# Patient Record
Sex: Male | Born: 1965 | Race: Black or African American | Hispanic: No | Marital: Married | Smoking: Former smoker
Health system: Southern US, Community
[De-identification: ages and names within clinical notes are randomized; demographics above are authoritative.]

## PROBLEM LIST (undated history)

## (undated) DIAGNOSIS — G4730 Sleep apnea, unspecified: Secondary | ICD-10-CM

## (undated) DIAGNOSIS — E785 Hyperlipidemia, unspecified: Secondary | ICD-10-CM

## (undated) DIAGNOSIS — G4733 Obstructive sleep apnea (adult) (pediatric): Secondary | ICD-10-CM

## (undated) DIAGNOSIS — I10 Essential (primary) hypertension: Secondary | ICD-10-CM

## (undated) HISTORY — PX: COLONOSCOPY: SHX174

## (undated) HISTORY — PX: POLYPECTOMY: SHX149

## (undated) HISTORY — DX: Obstructive sleep apnea (adult) (pediatric): G47.33

## (undated) HISTORY — PX: NO PAST SURGERIES: SHX2092

## (undated) HISTORY — DX: Essential (primary) hypertension: I10

## (undated) HISTORY — DX: Sleep apnea, unspecified: G47.30

## (undated) HISTORY — DX: Hyperlipidemia, unspecified: E78.5

---

## 2005-11-29 ENCOUNTER — Emergency Department (HOSPITAL_COMMUNITY): Admission: EM | Admit: 2005-11-29 | Discharge: 2005-11-29 | Payer: Self-pay | Admitting: Emergency Medicine

## 2010-10-03 ENCOUNTER — Emergency Department (HOSPITAL_COMMUNITY)
Admission: EM | Admit: 2010-10-03 | Discharge: 2010-10-04 | Disposition: A | Discharge status: Home / Self Care | Payer: BC Managed Care – PPO | Attending: Emergency Medicine | Admitting: Emergency Medicine

## 2010-10-03 DIAGNOSIS — K029 Dental caries, unspecified: Secondary | ICD-10-CM | POA: Insufficient documentation

## 2010-10-03 DIAGNOSIS — I10 Essential (primary) hypertension: Secondary | ICD-10-CM | POA: Insufficient documentation

## 2010-10-04 ENCOUNTER — Emergency Department (HOSPITAL_COMMUNITY): Payer: BC Managed Care – PPO

## 2010-10-04 LAB — POCT I-STAT TROPONIN I: Troponin i, poc: 0.04 ng/mL (ref 0.00–0.08)

## 2010-10-04 LAB — URINALYSIS, ROUTINE W REFLEX MICROSCOPIC
Glucose, UA: NEGATIVE mg/dL
Ketones, ur: NEGATIVE mg/dL
Leukocytes, UA: NEGATIVE
pH: 5.5 (ref 5.0–8.0)

## 2010-10-04 LAB — POCT I-STAT, CHEM 8
BUN: 22 mg/dL (ref 6–23)
Calcium, Ion: 1.24 mmol/L (ref 1.12–1.32)
Chloride: 103 mEq/L (ref 96–112)

## 2010-10-16 ENCOUNTER — Inpatient Hospital Stay (INDEPENDENT_AMBULATORY_CARE_PROVIDER_SITE_OTHER)
Admission: RE | Admit: 2010-10-16 | Discharge: 2010-10-16 | Disposition: A | Discharge status: Home / Self Care | Payer: BC Managed Care – PPO | Source: Ambulatory Visit | Attending: Emergency Medicine | Admitting: Emergency Medicine

## 2010-10-16 DIAGNOSIS — I10 Essential (primary) hypertension: Secondary | ICD-10-CM

## 2012-02-08 ENCOUNTER — Encounter: Payer: Self-pay | Admitting: Internal Medicine

## 2012-02-08 ENCOUNTER — Ambulatory Visit (INDEPENDENT_AMBULATORY_CARE_PROVIDER_SITE_OTHER): Payer: BC Managed Care – PPO | Admitting: Internal Medicine

## 2012-02-08 VITALS — BP 170/100 | HR 76 | Temp 97.9°F | Resp 18 | Ht 66.0 in | Wt 250.0 lb

## 2012-02-08 DIAGNOSIS — E669 Obesity, unspecified: Secondary | ICD-10-CM

## 2012-02-08 DIAGNOSIS — E6609 Other obesity due to excess calories: Secondary | ICD-10-CM

## 2012-02-08 DIAGNOSIS — Z0000 Encounter for general adult medical examination without abnormal findings: Secondary | ICD-10-CM

## 2012-02-08 DIAGNOSIS — I10 Essential (primary) hypertension: Secondary | ICD-10-CM

## 2012-02-08 LAB — GLUCOSE, POCT (MANUAL RESULT ENTRY): POC Glucose: 107 mg/dl — AB (ref 70–99)

## 2012-02-08 MED ORDER — AMLODIPINE BESYLATE 10 MG PO TABS: 10.0000 mg | ORAL_TABLET | Freq: Every day | ORAL | Status: DC

## 2012-02-08 MED ORDER — HYDROCHLOROTHIAZIDE 25 MG PO TABS: 25.0000 mg | ORAL_TABLET | Freq: Every day | ORAL | Status: DC

## 2012-02-08 NOTE — Patient Instructions (Addendum)
Limit your sodium (Salt) intake    It is important that you exercise regularly, at least 20 minutes 3 to 4 times per week.  If you develop chest pain or shortness of breath seek  medical attention.  You need to lose weight.  Consider a lower calorie diet and regular exercise.  Please check your blood pressure on a regular basis.  If it is consistently greater than 150/90, please make an office appointment.  Return in 6 months for follow-up Arterial Hypertension Arterial hypertension (high blood pressure) is a condition of elevated pressure in your blood vessels. Hypertension over a long period of time is a risk factor for strokes, heart attacks, and heart failure. It is also the leading cause of kidney (renal) failure.   CAUSES    In Adults -- Over 90% of all hypertension has no known cause. This is called essential or primary hypertension. In the other 10% of people with hypertension, the increase in blood pressure is caused by another disorder. This is called secondary hypertension. Important causes of secondary hypertension are:   Heavy alcohol use.   Obstructive sleep apnea.   Hyperaldosterosim (Conn's syndrome).   Steroid use.   Chronic kidney failure.   Hyperparathyroidism.   Medications.   Renal artery stenosis.   Pheochromocytoma.   Cushing's disease.   Coarctation of the aorta.   Scleroderma renal crisis.   Licorice (in excessive amounts).   Drugs (cocaine, methamphetamine).  Your caregiver can explain any items above that apply to you.  In Children -- Secondary hypertension is more common and should always be considered.   Pregnancy -- Few women of childbearing age have high blood pressure. However, up to 10% of them develop hypertension of pregnancy. Generally, this will not harm the woman. It may be a sign of 3 complications of pregnancy: preeclampsia, HELLP syndrome, and eclampsia. Follow up and control with medication is necessary.  SYMPTOMS    This  condition normally does not produce any noticeable symptoms. It is usually found during a routine exam.   Malignant hypertension is a late problem of high blood pressure. It may have the following symptoms:   Headaches.   Blurred vision.   End-organ damage (this means your kidneys, heart, lungs, and other organs are being damaged).   Stressful situations can increase the blood pressure. If a person with normal blood pressure has their blood pressure go up while being seen by their caregiver, this is often termed "white coat hypertension." Its importance is not known. It may be related with eventually developing hypertension or complications of hypertension.   Hypertension is often confused with mental tension, stress, and anxiety.  DIAGNOSIS   The diagnosis is made by 3 separate blood pressure measurements. They are taken at least 1 week apart from each other. If there is organ damage from hypertension, the diagnosis may be made without repeat measurements. Hypertension is usually identified by having blood pressure readings:  Above 140/90 mmHg measured in both arms, at 3 separate times, over a couple weeks.   Over 130/80 mmHg should be considered a risk factor and may require treatment in patients with diabetes.  Blood pressure readings over 120/80 mmHg are called "pre-hypertension" even in non-diabetic patients. To get a true blood pressure measurement, use the following guidelines. Be aware of the factors that can alter blood pressure readings.  Take measurements at least 1 hour after caffeine.   Take measurements 30 minutes after smoking and without any stress. This is another reason to  quit smoking  it raises your blood pressure.   Use a proper cuff size. Ask your caregiver if you are not sure about your cuff size.   Most home blood pressure cuffs are automatic. They will measure systolic and diastolic pressures. The systolic pressure is the pressure reading at the start of sounds.  Diastolic pressure is the pressure at which the sounds disappear. If you are elderly, measure pressures in multiple postures. Try sitting, lying or standing.   Sit at rest for a minimum of 5 minutes before taking measurements.   You should not be on any medications like decongestants. These are found in many cold medications.   Record your blood pressure readings and review them with your caregiver.  If you have hypertension:  Your caregiver may do tests to be sure you do not have secondary hypertension (see "causes" above).   Your caregiver may also look for signs of metabolic syndrome. This is also called Syndrome X or Insulin Resistance Syndrome. You may have this syndrome if you have type 2 diabetes, abdominal obesity, and abnormal blood lipids in addition to hypertension.   Your caregiver will take your medical and family history and perform a physical exam.   Diagnostic tests may include blood tests (for glucose, cholesterol, potassium, and kidney function), a urinalysis, or an EKG. Other tests may also be necessary depending on your condition.  PREVENTION   There are important lifestyle issues that you can adopt to reduce your chance of developing hypertension:  Maintain a normal weight.   Limit the amount of salt (sodium) in your diet.   Exercise often.   Limit alcohol intake.   Get enough potassium in your diet. Discuss specific advice with your caregiver.   Follow a DASH diet (dietary approaches to stop hypertension). This diet is rich in fruits, vegetables, and low-fat dairy products, and avoids certain fats.  PROGNOSIS   Essential hypertension cannot be cured. Lifestyle changes and medical treatment can lower blood pressure and reduce complications. The prognosis of secondary hypertension depends on the underlying cause. Many people whose hypertension is controlled with medicine or lifestyle changes can live a normal, healthy life.   RISKS AND COMPLICATIONS   While high  blood pressure alone is not an illness, it often requires treatment due to its short- and long-term effects on many organs. Hypertension increases your risk for:  CVAs or strokes (cerebrovascular accident).   Heart failure due to chronically high blood pressure (hypertensive cardiomyopathy).   Heart attack (myocardial infarction).   Damage to the retina (hypertensive retinopathy).   Kidney failure (hypertensive nephropathy).  Your caregiver can explain list items above that apply to you. Treatment of hypertension can significantly reduce the risk of complications. TREATMENT    For overweight patients, weight loss and regular exercise are recommended. Physical fitness lowers blood pressure.   Mild hypertension is usually treated with diet and exercise. A diet rich in fruits and vegetables, fat-free dairy products, and foods low in fat and salt (sodium) can help lower blood pressure. Decreasing salt intake decreases blood pressure in a 1/3 of people.   Stop smoking if you are a smoker.  The steps above are highly effective in reducing blood pressure. While these actions are easy to suggest, they are difficult to achieve. Most patients with moderate or severe hypertension end up requiring medications to bring their blood pressure down to a normal level. There are several classes of medications for treatment. Blood pressure pills (antihypertensives) will lower blood pressure by  their different actions. Lowering the blood pressure by 10 mmHg may decrease the risk of complications by as much as 25%. The goal of treatment is effective blood pressure control. This will reduce your risk for complications. Your caregiver will help you determine the best treatment for you according to your lifestyle. What is excellent treatment for one person, may not be for you. HOME CARE INSTRUCTIONS    Do not smoke.   Follow the lifestyle changes outlined in the "Prevention" section.   If you are on medications,  follow the directions carefully. Blood pressure medications must be taken as prescribed. Skipping doses reduces their benefit. It also puts you at risk for problems.   Follow up with your caregiver, as directed.   If you are asked to monitor your blood pressure at home, follow the guidelines in the "Diagnosis" section above.  SEEK MEDICAL CARE IF:    You think you are having medication side effects.   You have recurrent headaches or lightheadedness.   You have swelling in your ankles.   You have trouble with your vision.  SEEK IMMEDIATE MEDICAL CARE IF:    You have sudden onset of chest pain or pressure, difficulty breathing, or other symptoms of a heart attack.   You have a severe headache.   You have symptoms of a stroke (such as sudden weakness, difficulty speaking, difficulty walking).  MAKE SURE YOU:    Understand these instructions.   Will watch your condition.   Will get help right away if you are not doing well or get worse.  Document Released: 02/08/2005 Document Revised: 05/03/2011 Document Reviewed: 09/08/2006 [facility] Patient Information 2013 [place], [place].   DASH Diet The DASH diet stands for "Dietary Approaches to Stop Hypertension." It is a healthy eating plan that has been shown to reduce high blood pressure (hypertension) in as little as 14 days, while also possibly providing other significant health benefits. These other health benefits include reducing the risk of breast cancer after menopause and reducing the risk of type 2 diabetes, heart disease, colon cancer, and stroke. Health benefits also include weight loss and slowing kidney failure in patients with chronic kidney disease.   DIET GUIDELINES  Limit salt (sodium). Your diet should contain less than 1500 mg of sodium daily.   Limit refined or processed carbohydrates. Your diet should include mostly whole grains. Desserts and added sugars should be used sparingly.   Include small amounts of  heart-healthy fats. These types of fats include nuts, oils, and tub margarine. Limit saturated and trans fats. These fats have been shown to be harmful in the body.  CHOOSING FOODS   The following food groups are based on a 2000 calorie diet. See your Registered Dietitian for individual calorie needs. Grains and Grain Products (6 to 8 servings daily)  Eat More Often: Whole-wheat bread, brown rice, whole-grain or wheat pasta, quinoa, popcorn without added fat or salt (air popped).   Eat Less Often: White bread, white pasta, white rice, cornbread.  Vegetables (4 to 5 servings daily)  Eat More Often: Fresh, frozen, and canned vegetables. Vegetables may be raw, steamed, roasted, or grilled with a minimal amount of fat.   Eat Less Often/Avoid: Creamed or fried vegetables. Vegetables in a cheese sauce.  Fruit (4 to 5 servings daily)  Eat More Often: All fresh, canned (in natural juice), or frozen fruits. Dried fruits without added sugar. One hundred percent fruit juice ( cup [237 mL] daily).   Eat Less Often: Dried fruits  with added sugar. Canned fruit in light or heavy syrup.  [employer], Fish, and Poultry (2 servings or less daily. One serving is 3 to 4 oz [85-114 g]).  Eat More Often: Ninety percent or leaner ground beef, tenderloin, sirloin. Round cuts of beef, chicken breast, [place] breast. All fish. Grill, bake, or broil your meat. Nothing should be fried.   Eat Less Often/Avoid: Fatty cuts of meat, [place], or chicken leg, thigh, or wing. Fried cuts of meat or fish.  Dairy (2 to 3 servings)  Eat More Often: Low-fat or fat-free milk, low-fat plain or light yogurt, reduced-fat or part-skim cheese.   Eat Less Often/Avoid: Milk (whole, 2%). Whole milk yogurt. Full-fat cheeses.  Nuts, Seeds, and Legumes (4 to 5 servings per week)  Eat More Often: All without added salt.   Eat Less Often/Avoid: Salted nuts and seeds, canned beans with added salt.  Fats and Sweets (limited)  Eat More  Often: Vegetable oils, tub margarines without trans fats, sugar-free gelatin. Mayonnaise and salad dressings.   Eat Less Often/Avoid: Coconut oils, palm oils, butter, stick margarine, cream, half and half, cookies, candy, pie.  FOR MORE INFORMATION The Dash Diet Eating Plan: www.dashdiet.org Document Released: 01/28/2011 Document Revised: 05/03/2011 Document Reviewed: 01/28/2011 [facility] Patient Information 2013 [place], [place].   Diet for Gastroesophageal Reflux Disease, Adult Reflux (acid reflux) is when acid from your stomach flows up into the esophagus. When acid comes in contact with the esophagus, the acid causes irritation and soreness (inflammation) in the esophagus. When reflux happens often or so severely that it causes damage to the esophagus, it is called gastroesophageal reflux disease (GERD). Nutrition therapy can help ease the discomfort of GERD. FOODS OR DRINKS TO AVOID OR LIMIT  Smoking or chewing tobacco. Nicotine is one of the most potent stimulants to acid production in the gastrointestinal tract.   Caffeinated and decaffeinated coffee and black tea.   Regular or low-calorie carbonated beverages or energy drinks (caffeine-free carbonated beverages are allowed).     Strong spices, such as black pepper, white pepper, red pepper, cayenne, curry powder, and chili powder.   Peppermint or spearmint.   Chocolate.   High-fat foods, including meats and fried foods. Extra added fats including oils, butter, salad dressings, and nuts. Limit these to less than 8 tsp per day.   Fruits and vegetables if they are not tolerated, such as citrus fruits or tomatoes.   Alcohol.   Any food that seems to aggravate your condition.  If you have questions regarding your diet, call your caregiver or a registered dietitian. OTHER THINGS THAT MAY HELP GERD INCLUDE:    Eating your meals slowly, in a relaxed setting.   Eating 5 to 6 small meals per day instead of 3 large meals.    Eliminating food for a period of time if it causes distress.   Not lying down until 3 hours after eating a meal.   Keeping the head of your bed raised 6 to 9 inches (15 to 23 cm) by using a foam wedge or blocks under the legs of the bed. Lying flat may make symptoms worse.   Being physically active. Weight loss may be helpful in reducing reflux in overweight or obese adults.   Wear loose fitting clothing  EXAMPLE MEAL PLAN This meal plan is approximately 2,000 calories based on https://www.bernard.org/ meal planning guidelines. Breakfast   cup cooked oatmeal.   1 cup strawberries.   1 cup low-fat milk.   1 oz almonds.  Snack  1 cup cucumber slices.   6 oz yogurt (made from low-fat or fat-free milk).  Lunch  2 slice whole-wheat bread.   2 oz sliced [place].   2 tsp mayonnaise.   1 cup blueberries.   1 cup snap peas.  Snack  6 whole-wheat crackers.   1 oz string cheese.  Dinner   cup brown rice.   1 cup mixed veggies.   1 tsp olive oil.   3 oz grilled fish.  Document Released: 02/08/2005 Document Revised: 05/03/2011 Document Reviewed: 12/25/2010 [facility] Patient Information 2013 [place], [place].

## 2012-02-08 NOTE — Progress Notes (Signed)
Subjective:    Patient ID: Gregory Stephenson, male    DOB: 1965/12/24, 46 y.o.   MRN: [id number]  HPI 46 year old gentleman who is seen today to establish with our practice. He has a two-year history of treated hypertension but has been out of medications for about one month. He has no concerns or complaints. He discontinued tobacco use in 1997 and there is been some significant weight gain. His weight is presently 250. No other concerns or complaints. Past medical history is otherwise unremarkable. No hospitalizations or surgeries No allergies  Family history father died in his 38s complications of advanced COPD also may have had a cardiac condition Mother is 64 with a history of low back pain 5 brothers and 5 sisters positive for COPD asthma hypertension and diabetes Social history married one daughter and one grandson. Discontinued tobacco in 1997. 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 Hypertension     History   Social History  . Marital Status: Divorced    Spouse Name: N/A    Number of Children: N/A  . Years of Education: N/A   Occupational History  . Not on file.   Social History Main Topics  . Smoking status: Former Smoker    Types: Cigarettes    Quit date: 11/23/1995  . Smokeless tobacco: Never Used  . Alcohol Use: No  . Drug Use: No  . Sexually Active: Yes -- Male partner(s)   Other Topics Concern  . Not on file   Social History Narrative  . No narrative on file    No past surgical history on file.  No family history on file.  No Known Allergies  Current Outpatient Prescriptions on File Prior to Visit  Medication Sig Dispense Refill  . amLODipine (NORVASC) 10 MG tablet       . hydrochlorothiazide (HYDRODIURIL) 25 MG tablet         BP 170/100  Pulse 76  Temp 97.9 F (36.6 C) (Oral)  Resp 18  Ht 5\' 6"  (1.676 m)  Wt 250 lb (113.399 kg)  BMI 40.35 kg/m2  SpO2  97%       Review of Systems  Constitutional: Negative for fever, chills, activity change, appetite change and fatigue.  HENT: Negative for hearing loss, ear pain, congestion, rhinorrhea, sneezing, mouth sores, trouble swallowing, neck pain, neck stiffness, dental problem, voice change, sinus pressure and tinnitus.   Eyes: Negative for photophobia, pain, redness and visual disturbance.  Respiratory: Negative for apnea, cough, choking, chest tightness, shortness of breath and wheezing.   Cardiovascular: Negative for chest pain, palpitations and leg swelling.  Gastrointestinal: Negative for nausea, vomiting, abdominal pain, diarrhea, constipation, blood in stool, abdominal distention, anal bleeding and rectal pain.  Genitourinary: Negative for dysuria, urgency, frequency, hematuria, flank pain, decreased urine volume, discharge, penile swelling, scrotal swelling, difficulty urinating, genital sores and testicular pain.  Musculoskeletal: Negative for myalgias, back pain, joint swelling, arthralgias and gait problem.  Skin: Negative for color change, rash and wound.  Neurological: Negative for dizziness, tremors, seizures, syncope, facial asymmetry, speech difficulty, weakness, light-headedness, numbness and headaches.  Hematological: Negative for adenopathy. Does not bruise/bleed easily.  Psychiatric/Behavioral: Negative for suicidal ideas, hallucinations, behavioral problems, confusion, sleep disturbance, self-injury, dysphoric mood, decreased concentration and agitation. The patient is not nervous/anxious.        Objective:   Physical Exam  Constitutional: He appears well-developed and well-nourished.  HENT:  Head: Normocephalic and atraumatic.  Right  Ear: External ear normal.  Left Ear: External ear normal.  Nose: Nose normal.  Mouth/Throat: Oropharynx is clear and moist.  Eyes: Conjunctivae normal and EOM are normal. Pupils are equal, round, and reactive to light. No scleral icterus.   Neck: Normal range of motion. Neck supple. No JVD present. No thyromegaly present.  Cardiovascular: Regular rhythm, normal heart sounds and intact distal pulses.  Exam reveals no gallop and no friction rub.   No murmur heard. Pulmonary/Chest: Effort normal and breath sounds normal. He exhibits no tenderness.  Abdominal: Soft. Bowel sounds are normal. He exhibits no distension and no mass. There is no tenderness.  Genitourinary: Penis normal.       Uncircumcised  Musculoskeletal: Normal range of motion. He exhibits no edema and no tenderness.  Lymphadenopathy:    He has no cervical adenopathy.  Neurological: He is alert. He has normal reflexes. No cranial nerve deficit. Coordination normal.  Skin: Skin is warm and dry. No rash noted.  Psychiatric: He has a normal mood and affect. His behavior is normal.          Assessment & Plan:   Hypertension. Not well controlled off medication. We'll resume amlodipine and hydrochlorothiazide Obesity. Weight loss exercise encouraged. Home blood pressure monitoring encouraged  Recheck 6 months with lab

## 2012-09-11 ENCOUNTER — Ambulatory Visit (INDEPENDENT_AMBULATORY_CARE_PROVIDER_SITE_OTHER): Payer: BC Managed Care – PPO | Admitting: Internal Medicine

## 2012-09-11 ENCOUNTER — Encounter: Payer: Self-pay | Admitting: Internal Medicine

## 2012-09-11 ENCOUNTER — Ambulatory Visit: Payer: BC Managed Care – PPO | Admitting: Internal Medicine

## 2012-09-11 VITALS — BP 140/90 | HR 72 | Temp 98.6°F | Resp 20 | Wt 234.0 lb

## 2012-09-11 DIAGNOSIS — I10 Essential (primary) hypertension: Secondary | ICD-10-CM

## 2012-09-11 DIAGNOSIS — R3915 Urgency of urination: Secondary | ICD-10-CM

## 2012-09-11 DIAGNOSIS — E669 Obesity, unspecified: Secondary | ICD-10-CM

## 2012-09-11 DIAGNOSIS — E6609 Other obesity due to excess calories: Secondary | ICD-10-CM

## 2012-09-11 LAB — POCT URINALYSIS DIPSTICK
Leukocytes, UA: NEGATIVE
Nitrite, UA: NEGATIVE
Protein, UA: NEGATIVE
Urobilinogen, UA: 0.2
pH, UA: 6

## 2012-09-11 MED ORDER — DOXAZOSIN MESYLATE 4 MG PO TABS: 4.0000 mg | ORAL_TABLET | Freq: Every day | ORAL | Status: DC

## 2012-09-11 NOTE — Progress Notes (Signed)
Subjective:    Patient ID: Gregory Stephenson, male    DOB: [birth year], 47 y.o.   MRN: [id number]  HPI  47 year old patient who presents with a six-month history of urinary urgency and frequency. He states this is worse in the morning after taking blood pressure medications that includes diuretic therapy and improved in the afternoon. He still has nocturia 3-4 times nightly. No real obstructive symptoms. He does not use caffeinated beverages  Past Medical History  Diagnosis Date  . Hypertension     History   Social History  . Marital Status: Divorced    Spouse Name: N/A    Number of Children: N/A  . Years of Education: N/A   Occupational History  . Not on file.   Social History Main Topics  . Smoking status: Former Smoker    Types: Cigarettes    Quit date: 11/23/1995  . Smokeless tobacco: Never Used  . Alcohol Use: No  . Drug Use: No  . Sexually Active: Yes -- Male partner(s)   Other Topics Concern  . Not on file   Social History Narrative  . No narrative on file    History reviewed. No pertinent past surgical history.  No family history on file.  No Known Allergies  Current Outpatient Prescriptions on File Prior to Visit  Medication Sig Dispense Refill  . amLODipine (NORVASC) 10 MG tablet Take 1 tablet (10 mg total) by mouth daily.  90 tablet  3   No current facility-administered medications on file prior to visit.    BP 140/90  Pulse 72  Temp(Src) 98.6 F (37 C) (Oral)  Resp 20  Wt 234 lb (106.142 kg)  BMI 37.79 kg/m2  SpO2 97%       Review of Systems  Constitutional: Negative for fever, chills, appetite change and fatigue.  HENT: Negative for hearing loss, ear pain, congestion, sore throat, trouble swallowing, neck stiffness, dental problem, voice change and tinnitus.   Eyes: Negative for pain, discharge and visual disturbance.  Respiratory: Negative for cough, chest tightness, wheezing and stridor.   Cardiovascular: Negative for chest pain,  palpitations and leg swelling.  Gastrointestinal: Negative for nausea, vomiting, abdominal pain, diarrhea, constipation, blood in stool and abdominal distention.  Genitourinary: Positive for urgency and frequency. Negative for dysuria, hematuria, flank pain, discharge, difficulty urinating and genital sores.  Musculoskeletal: Negative for myalgias, back pain, joint swelling, arthralgias and gait problem.  Skin: Negative for rash.  Neurological: Negative for dizziness, syncope, speech difficulty, weakness, numbness and headaches.  Hematological: Negative for adenopathy. Does not bruise/bleed easily.  Psychiatric/Behavioral: Negative for behavioral problems and dysphoric mood. The patient is not nervous/anxious.        Objective:   Physical Exam  Constitutional: He is oriented to person, place, and time. He appears well-developed.   Blood pressure 140/84  HENT:  Head: Normocephalic.  Right Ear: External ear normal.  Left Ear: External ear normal.  Eyes: Conjunctivae and EOM are normal.  Neck: Normal range of motion.  Cardiovascular: Normal rate and normal heart sounds.   Pulmonary/Chest: Breath sounds normal.  Abdominal: Bowel sounds are normal.  Musculoskeletal: Normal range of motion. He exhibits no edema and no tenderness.  Neurological: He is alert and oriented to person, place, and time.  Psychiatric: He has a normal mood and affect. His behavior is normal.          Assessment & Plan:   Urinary urgency/frequency. Symptoms have been present for about 6 months. Urinalysis is normal. We'll discontinue  diuretic therapy and substitute doxazosin. Schedule CPX in 6 months. He will call if he does not have significant improvement Hypertension. Home blood pressure monitoring encouraged. Low-salt diet recommended

## 2012-09-11 NOTE — Patient Instructions (Addendum)
Discontinue hydrochlorothiazide  Doxazosin one half tablet at bedtime for 3 nights then take one whole tablet daily  Limit your sodium (Salt) intake    It is important that you exercise regularly, at least 20 minutes 3 to 4 times per week.  If you develop chest pain or shortness of breath seek  medical attention.  You need to lose weight.  Consider a lower calorie diet and regular exercise.  Please check your blood pressure on a regular basis.  If it is consistently greater than 150/90, please make an office appointment.

## 2013-02-05 ENCOUNTER — Other Ambulatory Visit: Payer: BC Managed Care – PPO

## 2013-02-12 ENCOUNTER — Ambulatory Visit (INDEPENDENT_AMBULATORY_CARE_PROVIDER_SITE_OTHER): Payer: BC Managed Care – PPO | Admitting: Internal Medicine

## 2013-02-12 ENCOUNTER — Encounter: Payer: Self-pay | Admitting: Internal Medicine

## 2013-02-12 VITALS — BP 120/84 | Temp 98.4°F | Ht 67.0 in | Wt 236.0 lb

## 2013-02-12 DIAGNOSIS — I10 Essential (primary) hypertension: Secondary | ICD-10-CM

## 2013-02-12 DIAGNOSIS — E6609 Other obesity due to excess calories: Secondary | ICD-10-CM

## 2013-02-12 DIAGNOSIS — Z23 Encounter for immunization: Secondary | ICD-10-CM

## 2013-02-12 DIAGNOSIS — Z0000 Encounter for general adult medical examination without abnormal findings: Secondary | ICD-10-CM

## 2013-02-12 DIAGNOSIS — E669 Obesity, unspecified: Secondary | ICD-10-CM

## 2013-02-12 LAB — LDL CHOLESTEROL, DIRECT: Direct LDL: 213.7 mg/dL

## 2013-02-12 LAB — CBC WITH DIFFERENTIAL/PLATELET
Basophils Absolute: 0 10*3/uL (ref 0.0–0.1)
Eosinophils Relative: 1.6 % (ref 0.0–5.0)
MCV: 90.5 fl (ref 78.0–100.0)
Monocytes Absolute: 0.3 10*3/uL (ref 0.1–1.0)
Neutrophils Relative %: 43 % (ref 43.0–77.0)
Platelets: 261 10*3/uL (ref 150.0–400.0)
RDW: 13.2 % (ref 11.5–14.6)
WBC: 5.3 10*3/uL (ref 4.5–10.5)

## 2013-02-12 LAB — COMPREHENSIVE METABOLIC PANEL
Albumin: 4.7 g/dL (ref 3.5–5.2)
CO2: 29 mEq/L (ref 19–32)
GFR: 76.64 mL/min (ref >60.00)
Glucose, Bld: 95 mg/dL (ref 70–99)
Potassium: 4.4 mEq/L (ref 3.5–5.1)
Sodium: 140 mEq/L (ref 135–145)
Total Protein: 7.7 g/dL (ref 6.0–8.3)

## 2013-02-12 LAB — PSA: PSA: 1.29 ng/mL (ref 0.10–4.00)

## 2013-02-12 MED ORDER — DOXAZOSIN MESYLATE 4 MG PO TABS: 4.0000 mg | ORAL_TABLET | Freq: Every day | ORAL | Status: DC

## 2013-02-12 MED ORDER — AMLODIPINE BESYLATE 10 MG PO TABS: 10.0000 mg | ORAL_TABLET | Freq: Every day | ORAL | Status: DC

## 2013-02-12 NOTE — Patient Instructions (Signed)
Limit your sodium (Salt) intake    It is important that you exercise regularly, at least 20 minutes 3 to 4 times per week.  If you develop chest pain or shortness of breath seek  medical attention.  You need to lose weight.  Consider a lower calorie diet and regular exercise.  Please check your blood pressure on a regular basis.  If it is consistently greater than 150/90, please make an office appointment.  Laboratory studies were obtained on your visit today and will be available in one or 2 business days.  IF there are any significant abnormalities, the office will call and discuss the results.  If there results are unremarkable, they will be available for your review after one week in MY CHART.  Return in one year for follow-up

## 2013-02-12 NOTE — Progress Notes (Signed)
Pre visit review using our clinic review tool, if applicable. No additional management support is needed unless otherwise documented below in the visit note. 

## 2013-02-12 NOTE — Progress Notes (Signed)
Subjective:    Patient ID: Gregory Stephenson, male    DOB: 08-27-1965, 47 y.o.   MRN: [id number]  HPI  Pre-visit discussion using our clinic review tool. No additional management support is needed unless otherwise documented below in the visit note.  Wt Readings from Last 3 Encounters:  02/12/13 236 lb (107.049 kg)  09/11/12 234 lb (106.142 kg)  02/08/12 250 lb (113.62 kg)    9 -year-old gentleman who is seen today for an annual exam. He has a three-year history of treated hypertension. He has no concerns or complaints. He discontinued tobacco use in 1997 and there is been some significant weight gain. His weight is presently 236. No other concerns or complaints. Past medical history is otherwise unremarkable. No hospitalizations or surgeries No allergies  Family history father died in his 87s complications of advanced COPD also may have had a cardiac condition Mother is 94 with a history of low back pain 5 brothers and 5 sisters positive for COPD asthma hypertension and diabetes Social history married one daughter and one grandson. Discontinued tobacco in 1997. 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 Hypertension     History   Social History  . Marital Status: Divorced    Spouse Name: N/A    Number of Children: N/A  . Years of Education: N/A   Occupational History  . Not on file.   Social History Main Topics  . Smoking status: Former Smoker    Types: Cigarettes    Quit date: 11/23/1995  . Smokeless tobacco: Never Used  . Alcohol Use: No  . Drug Use: No  . Sexual Activity: Yes    Partners: Female   Other Topics Concern  . Not on file   Social History Narrative  . No narrative on file    History reviewed. No pertinent past surgical history.  No family history on file.  No Known Allergies  Current Outpatient Prescriptions on File Prior to Visit  Medication Sig Dispense Refill  . amLODipine  (NORVASC) 10 MG tablet Take 1 tablet (10 mg total) by mouth daily.  90 tablet  3  . doxazosin (CARDURA) 4 MG tablet Take 1 tablet (4 mg total) by mouth at bedtime.  90 tablet  3   No current facility-administered medications on file prior to visit.    BP 120/84  Temp(Src) 98.4 F (36.9 C) (Oral)  Ht 5\' 7"  (1.702 m)  Wt 236 lb (107.049 kg)  BMI 36.95 kg/m2       Review of Systems  Constitutional: Negative for fever, chills, activity change, appetite change and fatigue.  HENT: Negative for congestion, dental problem, ear pain, hearing loss, mouth sores, rhinorrhea, sinus pressure, sneezing, tinnitus, trouble swallowing and voice change.   Eyes: Negative for photophobia, pain, redness and visual disturbance.  Respiratory: Negative for apnea, cough, choking, chest tightness, shortness of breath and wheezing.   Cardiovascular: Negative for chest pain, palpitations and leg swelling.  Gastrointestinal: Negative for nausea, vomiting, abdominal pain, diarrhea, constipation, blood in stool, abdominal distention, anal bleeding and rectal pain.  Genitourinary: Negative for dysuria, urgency, frequency, hematuria, flank pain, decreased urine volume, discharge, penile swelling, scrotal swelling, difficulty urinating, genital sores and testicular pain.  Musculoskeletal: Negative for arthralgias, back pain, gait problem, joint swelling, myalgias, neck pain and neck stiffness.  Skin: Negative for color change, rash and wound.  Neurological: Negative for dizziness, tremors, seizures, syncope, facial asymmetry, speech difficulty, weakness, light-headedness,  numbness and headaches.  Hematological: Negative for adenopathy. Does not bruise/bleed easily.  Psychiatric/Behavioral: Negative for suicidal ideas, hallucinations, behavioral problems, confusion, sleep disturbance, self-injury, dysphoric mood, decreased concentration and agitation. The patient is not nervous/anxious.        Objective:   Physical  Exam  Constitutional: He appears well-developed and well-nourished.  HENT:  Head: Normocephalic and atraumatic.  Right Ear: External ear normal.  Left Ear: External ear normal.  Nose: Nose normal.  Mouth/Throat: Oropharynx is clear and moist.  Eyes: Conjunctivae and EOM are normal. Pupils are equal, round, and reactive to light. No scleral icterus.  Neck: Normal range of motion. Neck supple. No JVD present. No thyromegaly present.  Cardiovascular: Regular rhythm, normal heart sounds and intact distal pulses.  Exam reveals no gallop and no friction rub.   No murmur heard. Pulmonary/Chest: Effort normal and breath sounds normal. He exhibits no tenderness.  Abdominal: Soft. Bowel sounds are normal. He exhibits no distension and no mass. There is no tenderness.  Genitourinary: Prostate normal and penis normal. Guaiac negative stool.  Uncircumcised  Musculoskeletal: Normal range of motion. He exhibits no edema and no tenderness.  Lymphadenopathy:    He has no cervical adenopathy.  Neurological: He is alert. He has normal reflexes. No cranial nerve deficit. Coordination normal.  Skin: Skin is warm and dry. No rash noted.  Psychiatric: He has a normal mood and affect. His behavior is normal.          Assessment & Plan:   Hypertension.  Obesity. Weight loss exercise encouraged. Home blood pressure monitoring encouraged  Recheck 12 months Laboratory update obtained today

## 2013-02-13 MED ORDER — ATORVASTATIN CALCIUM 40 MG PO TABS: 40.0000 mg | ORAL_TABLET | Freq: Every day | ORAL | Status: DC

## 2013-02-13 NOTE — Addendum Note (Signed)
Addended by: Aniceto Boss A on: 02/13/2013 02:08 PM   Modules accepted: Orders

## 2013-11-14 ENCOUNTER — Encounter (HOSPITAL_COMMUNITY): Payer: Self-pay | Admitting: Emergency Medicine

## 2013-11-14 ENCOUNTER — Emergency Department (HOSPITAL_COMMUNITY)
Admission: EM | Admit: 2013-11-14 | Discharge: 2013-11-14 | Disposition: A | Discharge status: Home / Self Care | Payer: BC Managed Care – PPO | Attending: Emergency Medicine | Admitting: Emergency Medicine

## 2013-11-14 DIAGNOSIS — Z87891 Personal history of nicotine dependence: Secondary | ICD-10-CM | POA: Diagnosis not present

## 2013-11-14 DIAGNOSIS — M25532 Pain in left wrist: Secondary | ICD-10-CM

## 2013-11-14 DIAGNOSIS — Z79899 Other long term (current) drug therapy: Secondary | ICD-10-CM | POA: Insufficient documentation

## 2013-11-14 DIAGNOSIS — M25539 Pain in unspecified wrist: Secondary | ICD-10-CM | POA: Diagnosis not present

## 2013-11-14 DIAGNOSIS — I10 Essential (primary) hypertension: Secondary | ICD-10-CM | POA: Diagnosis not present

## 2013-11-14 MED ORDER — NAPROXEN 500 MG PO TABS: 500.0000 mg | ORAL_TABLET | Freq: Two times a day (BID) | ORAL | Status: DC

## 2013-11-14 NOTE — ED Provider Notes (Signed)
CSN: [id number]     Arrival date & time 11/14/13  0900 History  This chart was scribed for non-physician practitioner, Quincy Carnes, PA-C working with Francine Graven, DO by Frederich Balding, ED scribe. This patient was seen in room TR06C/TR06C and the patient's care was started at 9:44 AM.   Chief Complaint  Patient presents with  . Wrist Pain   The history is provided by the patient. No language interpreter was used.   HPI Comments: SID GREENER is a 48 y.o. male who presents to the Emergency Department complaining of worsening left wrist pain that started 2 days ago. 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 Movement of the wrist worsens pain. Pt has taken tylenol with no relief. Denies fever, chills, elbow pain, wrist swelling, numbness or tingling in fingers. Pt is left hand dominant. No prior left wrist injuries or surgeries.  BP elevated, hx of HTN.  Past Medical History  Diagnosis Date  . Hypertension    History reviewed. No pertinent past surgical history. No family history on file. History  Substance Use Topics  . Smoking status: Former Smoker    Types: Cigarettes    Quit date: 11/23/1995  . Smokeless tobacco: Never Used  . Alcohol Use: No    Review of Systems  Constitutional: Negative for fever and chills.  Musculoskeletal: Positive for arthralgias. Negative for joint swelling.  Neurological: Negative for numbness.  All other systems reviewed and are negative.  Allergies  Review of patient's allergies indicates no known allergies.  Home Medications   Prior to Admission medications   Medication Sig Start Date End Date Taking? Authorizing Provider  amLODipine (NORVASC) 10 MG tablet Take 1 tablet (10 mg total) by mouth daily. 02/12/13   Marletta Lor, MD  atorvastatin (LIPITOR) 40 MG tablet Take 1 tablet (40 mg total) by mouth daily. 02/13/13   Marletta Lor, MD  doxazosin (CARDURA) 4 MG tablet Take 1 tablet (4 mg total) by mouth at  bedtime. 02/12/13   Marletta Lor, MD   BP 190/99  Pulse 84  Temp(Src) 98.9 F (37.2 C) (Oral)  Resp 18  SpO2 96%  Physical Exam  Nursing note and vitals reviewed. Constitutional: He is oriented to person, place, and time. He appears well-developed and well-nourished.  HENT:  Head: Normocephalic and atraumatic.  Mouth/Throat: Oropharynx is clear and moist.  Eyes: Conjunctivae and EOM are normal. Pupils are equal, round, and reactive to light.  Neck: Normal range of motion.  Cardiovascular: Normal rate, regular rhythm and normal heart sounds.   Pulmonary/Chest: Effort normal and breath sounds normal.  Musculoskeletal: Normal range of motion.       Left wrist: Normal.  Left wrist without visible swelling or deformities. Pain with palmar and dorsal. Strong radial pulse and cap refill. Normal grip strength and sensation throughout hand.  Moving all fingers appropriately.  Neurological: He is alert and oriented to person, place, and time.  Skin: Skin is warm and dry.  Psychiatric: He has a normal mood and affect.    ED Course  Procedures (including critical care time)  DIAGNOSTIC STUDIES: Oxygen Saturation is 96% on RA, normal by my interpretation.    COORDINATION OF CARE: 9:46 AM-Discussed treatment plan which includes wrist brace and an anti-inflammatory with pt at bedside and pt agreed to plan.   Labs Review Labs Reviewed - No data to display  Imaging Review No results found.   EKG Interpretation None  MDM   Final diagnoses:  Left wrist pain   Likely overuse injury/tendonitis as patient is left handed.  Low suspicion for acute fx/dislocation-- imaging deferred. Normal neuro exam. Patient placed in wrist splint, encouraged RICE routine.  Rx naprosyn.  FU with PCP.  Discussed plan with patient, he/she acknowledged understanding and agreed with plan of care.  Return precautions given for new or worsening symptoms.  Note-- BP elevated, patient has hx of HTN and  has not taken his meds today.  I personally performed the services described in this documentation, which was scribed in my presence. The recorded information has been reviewed and is accurate.  Larene Pickett, PA-C 11/14/13 1105

## 2013-11-14 NOTE — ED Provider Notes (Signed)
Medical screening examination/treatment/procedure(s) were performed by non-physician practitioner and as supervising physician I was immediately available for consultation/collaboration.   EKG Interpretation None        Francine Graven, DO 11/14/13 1821

## 2013-11-14 NOTE — ED Notes (Signed)
Patient states he is on bp medication, but did not take any this morning.

## 2013-11-14 NOTE — Discharge Instructions (Signed)
Take the prescribed medication as directed. °Follow-up with your primary care physician. °Return to the ED for new or worsening symptoms. ° °

## 2013-11-14 NOTE — ED Notes (Signed)
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

## 2014-03-09 ENCOUNTER — Other Ambulatory Visit: Payer: Self-pay | Admitting: Internal Medicine

## 2014-03-12 ENCOUNTER — Telehealth: Payer: Self-pay | Admitting: Internal Medicine

## 2014-03-12 MED ORDER — AMLODIPINE BESYLATE 10 MG PO TABS: 10.0000 mg | ORAL_TABLET | Freq: Every day | ORAL | Status: DC

## 2014-03-12 NOTE — Telephone Encounter (Signed)
Pt notified Rx sent to pharmacy and needs to schedule a physical. Pt said he already scheduled for March. Told pt okay.

## 2014-03-12 NOTE — Telephone Encounter (Signed)
Pt request refill of the following: amLODipine (NORVASC) 10 MG tablet   Phamacy: [facility]

## 2014-04-05 ENCOUNTER — Encounter (HOSPITAL_BASED_OUTPATIENT_CLINIC_OR_DEPARTMENT_OTHER): Payer: Self-pay

## 2014-04-05 ENCOUNTER — Emergency Department (HOSPITAL_BASED_OUTPATIENT_CLINIC_OR_DEPARTMENT_OTHER)
Admission: EM | Admit: 2014-04-05 | Discharge: 2014-04-05 | Disposition: A | Discharge status: Home / Self Care | Payer: Worker's Compensation | Attending: Emergency Medicine | Admitting: Emergency Medicine

## 2014-04-05 ENCOUNTER — Emergency Department (HOSPITAL_BASED_OUTPATIENT_CLINIC_OR_DEPARTMENT_OTHER): Payer: Worker's Compensation

## 2014-04-05 DIAGNOSIS — X58XXXA Exposure to other specified factors, initial encounter: Secondary | ICD-10-CM | POA: Insufficient documentation

## 2014-04-05 DIAGNOSIS — Y9289 Other specified places as the place of occurrence of the external cause: Secondary | ICD-10-CM | POA: Insufficient documentation

## 2014-04-05 DIAGNOSIS — Z791 Long term (current) use of non-steroidal anti-inflammatories (NSAID): Secondary | ICD-10-CM | POA: Diagnosis not present

## 2014-04-05 DIAGNOSIS — S99911A Unspecified injury of right ankle, initial encounter: Secondary | ICD-10-CM | POA: Diagnosis present

## 2014-04-05 DIAGNOSIS — Z87891 Personal history of nicotine dependence: Secondary | ICD-10-CM | POA: Insufficient documentation

## 2014-04-05 DIAGNOSIS — Y9389 Activity, other specified: Secondary | ICD-10-CM | POA: Insufficient documentation

## 2014-04-05 DIAGNOSIS — Y998 Other external cause status: Secondary | ICD-10-CM | POA: Insufficient documentation

## 2014-04-05 DIAGNOSIS — I10 Essential (primary) hypertension: Secondary | ICD-10-CM | POA: Insufficient documentation

## 2014-04-05 DIAGNOSIS — Z79899 Other long term (current) drug therapy: Secondary | ICD-10-CM | POA: Diagnosis not present

## 2014-04-05 DIAGNOSIS — S93401A Sprain of unspecified ligament of right ankle, initial encounter: Secondary | ICD-10-CM | POA: Diagnosis not present

## 2014-04-05 MED ORDER — NAPROXEN 500 MG PO TABS: 500.0000 mg | ORAL_TABLET | Freq: Two times a day (BID) | ORAL | Status: DC

## 2014-04-05 NOTE — ED Provider Notes (Signed)
CSN: [id number]     Arrival date & time 04/05/14  1253 History   First MD Initiated Contact with Patient 04/05/14 1259     Chief Complaint  Patient presents with  . Ankle Pain     (Consider location/radiation/quality/duration/timing/severity/associated sxs/prior Treatment) HPI Gregory Stephenson is a 49 y.o. male with hx of HTN, presents to ED complaining of right ankle injury. Pt states he was on the job when stepped [place] a [place]. States twisted right ankle. Reports pain, swelling to that ankle. Pt reports prior sprains to the same ankle. Denies hx of fractures. Pain is mainly to the lateral ankle. Worse with movement and bearing weight. No tx prior to arrival.   Past Medical History  Diagnosis Date  . Hypertension    History reviewed. No pertinent past surgical history. History reviewed. No pertinent family history. History  Substance Use Topics  . Smoking status: Former Smoker    Types: Cigarettes    Quit date: 11/23/1995  . Smokeless tobacco: Never Used  . Alcohol Use: No    Review of Systems  Constitutional: Negative for fever and chills.  Musculoskeletal: Positive for joint swelling and arthralgias.  Neurological: Negative for weakness and numbness.      Allergies  Review of patient's allergies indicates no known allergies.  Home Medications   Prior to Admission medications   Medication Sig Start Date End Date Taking? Authorizing Provider  amLODipine (NORVASC) 10 MG tablet Take 1 tablet (10 mg total) by mouth daily. 03/12/14  Yes Marletta Lor, MD  atorvastatin (LIPITOR) 40 MG tablet Take 1 tablet (40 mg total) by mouth daily. 02/13/13  Yes Marletta Lor, MD  doxazosin (CARDURA) 4 MG tablet Take 1 tablet (4 mg total) by mouth at bedtime. 02/12/13   Marletta Lor, MD  naproxen (NAPROSYN) 500 MG tablet Take 1 tablet (500 mg total) by mouth 2 (two) times daily with a meal. 11/14/13   Larene Pickett, PA-C   BP 163/105 mmHg  Pulse 94   Temp(Src) 97.9 F (36.6 C) (Oral)  Resp 18  Ht 5\' 7"  (1.702 m)  Wt 250 lb (113.399 kg)  BMI 39.15 kg/m2  SpO2 98% Physical Exam  Constitutional: He appears well-developed and well-nourished. No distress.  Eyes: Conjunctivae are normal.  Neck: Neck supple.  Cardiovascular: Normal rate, regular rhythm and normal heart sounds.   Pulmonary/Chest: Effort normal and breath sounds normal. No respiratory distress. He has no wheezes. He has no rales.  Musculoskeletal: He exhibits no edema.  Minimal swelling to the right lateral ankle. TTP over lateral malleolus. Achilles tendon non tender and intact. Pain with dorsiflexion, plantarflexion, inversion. Joint stable. No pain or TTP at knee or rest of the foot. DP pulses intact  Nursing note and vitals reviewed.   ED Course  Procedures (including critical care time) Labs Review Labs Reviewed - No data to display  Imaging Review Dg Ankle Complete Right  04/05/2014   CLINICAL DATA:  Rolled ankle jumping of the truck. Medial ankle pain.  EXAM: RIGHT ANKLE - COMPLETE 3+ VIEW  COMPARISON:  None.  FINDINGS: Negative for a fracture or dislocation. No significant soft tissue swelling. Mild degenerative changes along the dorsal aspect of the talonavicular joint.  IMPRESSION: No acute bone abnormality in the right ankle.   Electronically Signed   By: Markus Daft M.D.   On: 04/05/2014 14:21     EKG Interpretation None      MDM   Final diagnoses:  Ankle sprain, right, initial encounter     Pt with right ankle injury. Neurovascularly intact. Xray negative. Home with ASO brace, NSAIDs, follow up with PCP.    Unable to fit ASO, too small even the largest size. ACE wrap applied. Home with follow up.   Filed Vitals:   04/05/14 1300 04/05/14 1449  BP: 163/105 170/106  Pulse: 94 88  Temp: 97.9 F (36.6 C)   TempSrc: Oral   Resp: 18 18  Height: 5\' 7"  (1.702 m)   Weight: 250 lb (113.399 kg)   SpO2: 98% 98%     Renold Genta,  PA-C 04/05/14 1711  Orpah Greek, MD 04/08/14 1422

## 2014-04-05 NOTE — Discharge Instructions (Signed)
Keep ankle elevated. Ice several times a day. Stay off as much as possible in the next 3 days. Naprosyn for pain and inflammation. Follow up with your doctor if not improving.    Ankle Sprain An ankle sprain is an injury to the strong, fibrous tissues (ligaments) that hold the bones of your ankle joint together.  CAUSES An ankle sprain is usually caused by a fall or by twisting your ankle. Ankle sprains most commonly occur when you step on the outer edge of your foot, and your ankle turns inward. People who participate in sports are more prone to these types of injuries.  SYMPTOMS   Pain in your ankle. The pain may be present at rest or only when you are trying to stand or walk.  Swelling.  Bruising. Bruising may develop immediately or within 1 to 2 days after your injury.  Difficulty standing or walking, particularly when turning corners or changing directions. DIAGNOSIS  Your caregiver will ask you details about your injury and perform a physical exam of your ankle to determine if you have an ankle sprain. During the physical exam, your caregiver will press on and apply pressure to specific areas of your foot and ankle. Your caregiver will try to move your ankle in certain ways. An X-ray exam may be done to be sure a bone was not broken or a ligament did not separate from one of the bones in your ankle (avulsion fracture).  TREATMENT  Certain types of braces can help stabilize your ankle. Your caregiver can make a recommendation for this. Your caregiver may recommend the use of medicine for pain. If your sprain is severe, your caregiver may refer you to a surgeon who helps to restore function to parts of your skeletal system (orthopedist) or a physical therapist. [place] ice to your injury for 1-2 days or as directed by your caregiver. Applying ice helps to reduce inflammation and pain.  Put ice in a plastic bag.  Place a towel between your skin and the  bag.  Leave the ice on for 15-20 minutes at a time, every 2 hours while you are awake.  Only take over-the-counter or prescription medicines for pain, discomfort, or fever as directed by your caregiver.  Elevate your injured ankle above the level of your heart as much as possible for 2-3 days.  If your caregiver recommends crutches, use them as instructed. Gradually put weight on the affected ankle. Continue to use crutches or a cane until you can walk without feeling pain in your ankle.  If you have a plaster splint, wear the splint as directed by your caregiver. Do not rest it on anything harder than a pillow for the first 24 hours. Do not put weight on it. Do not get it wet. You may take it off to take a shower or bath.  You may have been given an elastic bandage to wear around your ankle to provide support. If the elastic bandage is too tight (you have numbness or tingling in your foot or your foot becomes cold and blue), adjust the bandage to make it comfortable.  If you have an air splint, you may blow more air into it or let air out to make it more comfortable. You may take your splint off at night and before taking a shower or bath. Wiggle your toes in the splint several times per day to decrease swelling. SEEK MEDICAL CARE IF:   You have rapidly increasing bruising  or swelling.  Your toes feel extremely cold or you lose feeling in your foot.  Your pain is not relieved with medicine. SEEK IMMEDIATE MEDICAL CARE IF:  Your toes are numb or blue.  You have severe pain that is increasing. MAKE SURE YOU:   Understand these instructions.  Will watch your condition.  Will get help right away if you are not doing well or get worse. Document Released: 02/08/2005 Document Revised: 11/03/2011 Document Reviewed: 02/20/2011 [facility] Patient Information 2015 [place], [place]. This information is not intended to replace advice given to you by your health care provider. Make sure you  discuss any questions you have with your health care provider.

## 2014-04-05 NOTE — ED Notes (Addendum)
Pt reports he stepped into a hole at the [facility] and twisted his right ankle - c/o pain. 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

## 2014-04-12 ENCOUNTER — Other Ambulatory Visit (INDEPENDENT_AMBULATORY_CARE_PROVIDER_SITE_OTHER): Payer: BC Managed Care – PPO

## 2014-04-12 DIAGNOSIS — Z0000 Encounter for general adult medical examination without abnormal findings: Secondary | ICD-10-CM

## 2014-04-12 LAB — BASIC METABOLIC PANEL
BUN: 17 mg/dL (ref 6–23)
CALCIUM: 10.1 mg/dL (ref 8.4–10.5)
CO2: 31 meq/L (ref 19–32)
CREATININE: 1.35 mg/dL (ref 0.40–1.50)
Chloride: 105 mEq/L (ref 96–112)
GFR: 71.74 mL/min (ref >60.00)
GLUCOSE: 117 mg/dL — AB (ref 70–99)
Potassium: 4.6 mEq/L (ref 3.5–5.1)
Sodium: 141 mEq/L (ref 135–145)

## 2014-04-12 LAB — HEPATIC FUNCTION PANEL
ALBUMIN: 4.4 g/dL (ref 3.5–5.2)
ALT: 60 U/L — AB (ref 0–53)
AST: 39 U/L — AB (ref 0–37)
Alkaline Phosphatase: 70 U/L (ref 39–117)
Bilirubin, Direct: 0.1 mg/dL (ref 0.0–0.3)
Total Bilirubin: 0.6 mg/dL (ref 0.2–1.2)
Total Protein: 7.3 g/dL (ref 6.0–8.3)

## 2014-04-12 LAB — CBC WITH DIFFERENTIAL/PLATELET
BASOS ABS: 0 10*3/uL (ref 0.0–0.1)
Basophils Relative: 0.4 % (ref 0.0–3.0)
EOS ABS: 0.2 10*3/uL (ref 0.0–0.7)
Eosinophils Relative: 2.4 % (ref 0.0–5.0)
HEMATOCRIT: 45.3 % (ref 39.0–52.0)
Hemoglobin: 15 g/dL (ref 13.0–17.0)
LYMPHS ABS: 2.9 10*3/uL (ref 0.7–4.0)
Lymphocytes Relative: 45.4 % (ref 12.0–46.0)
MCHC: 33 g/dL (ref 30.0–36.0)
MCV: 90.2 fl (ref 78.0–100.0)
MONO ABS: 0.5 10*3/uL (ref 0.1–1.0)
MONOS PCT: 7.5 % (ref 3.0–12.0)
Neutro Abs: 2.8 10*3/uL (ref 1.4–7.7)
Neutrophils Relative %: 44.3 % (ref 43.0–77.0)
PLATELETS: 292 10*3/uL (ref 150.0–400.0)
RBC: 5.03 Mil/uL (ref 4.22–5.81)
RDW: 13.5 % (ref 11.5–15.5)
WBC: 6.4 10*3/uL (ref 4.0–10.5)

## 2014-04-12 LAB — POCT URINALYSIS DIP (MANUAL ENTRY)
BILIRUBIN UA: NEGATIVE
BILIRUBIN UA: NEGATIVE
Blood, UA: NEGATIVE
GLUCOSE UA: NEGATIVE
LEUKOCYTES UA: NEGATIVE
Nitrite, UA: NEGATIVE
Protein Ur, POC: NEGATIVE
Spec Grav, UA: 1.02
Urobilinogen, UA: 1
pH, UA: 5.5

## 2014-04-12 LAB — LIPID PANEL
Cholesterol: 256 mg/dL — ABNORMAL HIGH (ref 0–200)
HDL: 30.9 mg/dL — AB (ref >39.00)
LDL Cholesterol: 194 mg/dL — ABNORMAL HIGH (ref 0–99)
NonHDL: 225.1
TRIGLYCERIDES: 157 mg/dL — AB (ref 0.0–149.0)
Total CHOL/HDL Ratio: 8
VLDL: 31.4 mg/dL (ref 0.0–40.0)

## 2014-04-12 LAB — PSA: PSA: 1.06 ng/mL (ref 0.10–4.00)

## 2014-04-12 LAB — TSH: TSH: 1.24 u[IU]/mL (ref 0.35–4.50)

## 2014-04-23 ENCOUNTER — Other Ambulatory Visit: Payer: BC Managed Care – PPO

## 2014-04-30 ENCOUNTER — Ambulatory Visit (INDEPENDENT_AMBULATORY_CARE_PROVIDER_SITE_OTHER): Payer: BC Managed Care – PPO | Admitting: Internal Medicine

## 2014-04-30 ENCOUNTER — Encounter: Payer: Self-pay | Admitting: Internal Medicine

## 2014-04-30 ENCOUNTER — Other Ambulatory Visit: Payer: Self-pay | Admitting: *Deleted

## 2014-04-30 ENCOUNTER — Encounter: Payer: BC Managed Care – PPO | Admitting: Internal Medicine

## 2014-04-30 VITALS — BP 160/100 | HR 92 | Temp 98.5°F | Resp 20 | Ht 66.0 in | Wt 254.0 lb

## 2014-04-30 DIAGNOSIS — I10 Essential (primary) hypertension: Secondary | ICD-10-CM

## 2014-04-30 DIAGNOSIS — G4733 Obstructive sleep apnea (adult) (pediatric): Secondary | ICD-10-CM

## 2014-04-30 DIAGNOSIS — Z0000 Encounter for general adult medical examination without abnormal findings: Secondary | ICD-10-CM

## 2014-04-30 MED ORDER — ATORVASTATIN CALCIUM 40 MG PO TABS: 40.0000 mg | ORAL_TABLET | Freq: Every day | ORAL | Status: DC

## 2014-04-30 MED ORDER — AMLODIPINE BESYLATE 10 MG PO TABS: 10.0000 mg | ORAL_TABLET | Freq: Every day | ORAL | Status: DC

## 2014-04-30 MED ORDER — DOXAZOSIN MESYLATE 4 MG PO TABS: 4.0000 mg | ORAL_TABLET | Freq: Every day | ORAL | Status: DC

## 2014-04-30 MED ORDER — LISINOPRIL-HYDROCHLOROTHIAZIDE 20-25 MG PO TABS: 1.0000 | ORAL_TABLET | Freq: Every day | ORAL | Status: DC

## 2014-04-30 NOTE — Patient Instructions (Signed)
Limit your sodium (Salt) intake    It is important that you exercise regularly, at least 20 minutes 3 to 4 times per week.  If you develop chest pain or shortness of breath seek  medical attention.  You need to lose weight.  Consider a lower calorie diet and regular exercise.  Health Maintenance A healthy lifestyle and preventative care can promote health and wellness.  Maintain regular health, dental, and eye exams.  Eat a healthy diet. Foods like vegetables, fruits, whole grains, low-fat dairy products, and lean protein foods contain the nutrients you need and are low in calories. Decrease your intake of foods high in solid fats, added sugars, and salt. Get information about a proper diet from your health care provider, if necessary.  Regular physical exercise is one of the most important things you can do for your health. Most adults should get at least 150 minutes of moderate-intensity exercise (any activity that increases your heart rate and causes you to sweat) each week. In addition, most adults need muscle-strengthening exercises on 2 or more days a week.   Maintain a healthy weight. The body mass index (BMI) is a screening tool to identify possible weight problems. It provides an estimate of body fat based on height and weight. Your health care provider can find your BMI and can help you achieve or maintain a healthy weight. For males 20 years and older:  A BMI below 18.5 is considered underweight.  A BMI of 18.5 to 24.9 is normal.  A BMI of 25 to 29.9 is considered overweight.  A BMI of 30 and above is considered obese.  Maintain normal blood lipids and cholesterol by exercising and minimizing your intake of saturated fat. Eat a balanced diet with plenty of fruits and vegetables. Blood tests for lipids and cholesterol should begin at age 45 and be repeated every 5 years. If your lipid or cholesterol levels are high, you are over age 53, or you are at high risk for heart disease,  you may need your cholesterol levels checked more frequently.Ongoing high lipid and cholesterol levels should be treated with medicines if diet and exercise are not working.  If you smoke, find out from your health care provider how to quit. If you do not use tobacco, do not start.  Lung cancer screening is recommended for adults aged 73-80 years who are at high risk for developing lung cancer because of a history of smoking. A yearly low-dose CT scan of the lungs is recommended for people who have at least a 30-pack-year history of smoking and are current smokers or have quit within the past 15 years. A pack year of smoking is smoking an average of 1 pack of cigarettes a day for 1 year (for example, a 30-pack-year history of smoking could mean smoking 1 pack a day for 30 years or 2 packs a day for 15 years). Yearly screening should continue until the smoker has stopped smoking for at least 15 years. Yearly screening should be stopped for people who develop a health problem that would prevent them from having lung cancer treatment.  If you choose to drink alcohol, do not have more than 2 drinks per day. One drink is considered to be 12 oz (360 mL) of beer, 5 oz (150 mL) of wine, or 1.5 oz (45 mL) of liquor.  Avoid the use of street drugs. Do not share needles with anyone. Ask for help if you need support or instructions about stopping the use  of drugs.  High blood pressure causes heart disease and increases the risk of stroke. Blood pressure should be checked at least every 1-2 years. Ongoing high blood pressure should be treated with medicines if weight loss and exercise are not effective.  If you are 45-50 years old, ask your health care provider if you should take aspirin to prevent heart disease.  Diabetes screening involves taking a blood sample to check your fasting blood sugar level. This should be done once every 3 years after age 25 if you are at a normal weight and without risk factors for  diabetes. Testing should be considered at a younger age or be carried out more frequently if you are overweight and have at least 1 risk factor for diabetes.  Colorectal cancer can be detected and often prevented. Most routine colorectal cancer screening begins at the age of 81 and continues through age 88. However, your health care provider may recommend screening at an earlier age if you have risk factors for colon cancer. On a yearly basis, your health care provider may provide home test kits to check for hidden blood in the stool. A small camera at the end of a tube may be used to directly examine the colon (sigmoidoscopy or colonoscopy) to detect the earliest forms of colorectal cancer. Talk to your health care provider about this at age 59 when routine screening begins. A direct exam of the colon should be repeated every 5-10 years through age 74, unless early forms of precancerous polyps or small growths are found.  People who are at an increased risk for hepatitis B should be screened for this virus. You are considered at high risk for hepatitis B if:  You were born in a country where hepatitis B occurs often. Talk with your health care provider about which countries are considered high risk.  Your parents were born in a high-risk country and you have not received a shot to protect against hepatitis B (hepatitis B vaccine).  You have HIV or AIDS.  You use needles to inject street drugs.  You live with, or have sex with, someone who has hepatitis B.  You are a man who has sex with other men (MSM).  You get hemodialysis treatment.  You take certain medicines for conditions like cancer, organ transplantation, and autoimmune conditions.  Hepatitis C blood testing is recommended for all people born from [birth year] through 1967 and any individual with known risk factors for hepatitis C.  Healthy men should no longer receive prostate-specific antigen (PSA) blood tests as part of routine cancer  screening. Talk to your health care provider about prostate cancer screening.  Testicular cancer screening is not recommended for adolescents or adult males who have no symptoms. Screening includes self-exam, a health care provider exam, and other screening tests. Consult with your health care provider about any symptoms you have or any concerns you have about testicular cancer.  Practice safe sex. Use condoms and avoid high-risk sexual practices to reduce the spread of sexually transmitted infections (STIs).  You should be screened for STIs, including gonorrhea and chlamydia if:  You are sexually active and are younger than 24 years.  You are older than 24 years, and your health care provider tells you that you are at risk for this type of infection.  Your sexual activity has changed since you were last screened, and you are at an increased risk for chlamydia or gonorrhea. Ask your health care provider if you are at risk.  If you are at risk of being infected with HIV, it is recommended that you take a prescription medicine daily to prevent HIV infection. This is called pre-exposure prophylaxis (PrEP). You are considered at risk if:  You are a man who has sex with other men (MSM).  You are a heterosexual man who is sexually active with multiple partners.  You take drugs by injection.  You are sexually active with a partner who has HIV.  Talk with your health care provider about whether you are at high risk of being infected with HIV. If you choose to begin PrEP, you should first be tested for HIV. You should then be tested every 3 months for as long as you are taking PrEP.  Use sunscreen. Apply sunscreen liberally and repeatedly throughout the day. You should seek shade when your shadow is shorter than you. Protect yourself by wearing long sleeves, pants, a wide-brimmed hat, and sunglasses year round whenever you are outdoors.  Tell your health care provider of new moles or changes in  moles, especially if there is a change in shape or color. Also, tell your health care provider if a mole is larger than the size of a pencil eraser.  A one-time screening for abdominal aortic aneurysm (AAA) and surgical repair of large AAAs by ultrasound is recommended for men aged 17-75 years who are current or former smokers.  Stay current with your vaccines (immunizations). Document Released: 08/07/2007 Document Revised: 02/13/2013 Document Reviewed: 07/06/2010 [facility] Patient Information 2015 [place], [place]. This information is not intended to replace advice given to you by your health care provider. Make sure you discuss any questions you have with your health care provider. Obesity Obesity is defined as having too much total body fat and a body mass index (BMI) of 30 or more. BMI is an estimate of body fat and is calculated from your height and weight. Obesity happens when you consume more calories than you can burn by exercising or performing daily physical tasks. Prolonged obesity can cause major illnesses or emergencies, such as:   Stroke.  Heart disease.  Diabetes.  Cancer.  Arthritis.  High blood pressure (hypertension).  High cholesterol.  Sleep apnea.  Erectile dysfunction.  Infertility problems. CAUSES   Regularly eating unhealthy foods.  Physical inactivity.  Certain disorders, such as an underactive thyroid (hypothyroidism), Cushing's syndrome, and polycystic ovarian syndrome.  Certain medicines, such as steroids, some depression medicines, and antipsychotics.  Genetics.  Lack of sleep. DIAGNOSIS  A health care provider can diagnose obesity after calculating your BMI. Obesity will be diagnosed if your BMI is 30 or higher.  There are other methods of measuring obesity levels. Some other methods include measuring your skinfold thickness, your waist circumference, and comparing your hip circumference to your waist circumference. TREATMENT  A healthy  treatment program includes some or all of the following:  Long-term dietary changes.  Exercise and physical activity.  Behavioral and lifestyle changes.  Medicine only under the supervision of your health care provider. Medicines may help, but only if they are used with diet and exercise programs. An unhealthy treatment program includes:  Fasting.  Fad diets.  Supplements and drugs. These choices do not succeed in long-term weight control.  HOME CARE INSTRUCTIONS   Exercise and perform physical activity as directed by your health care provider. To increase physical activity, try the following:  Use stairs instead of elevators.  Park farther away from store entrances.  Garden, bike, or walk instead of watching television  or using the computer.  Eat healthy, low-calorie foods and drinks on a regular basis. Eat more fruits and vegetables. Use low-calorie cookbooks or take healthy cooking classes.  Limit fast food, sweets, and processed snack foods.  Eat smaller portions.  Keep a daily journal of everything you eat. There are many free websites to help you with this. It may be helpful to measure your foods so you can determine if you are eating the correct portion sizes.  Avoid drinking alcohol. Drink more water and drinks without calories.  Take vitamins and supplements only as recommended by your health care provider.  Weight-loss support groups, Tax adviser, counselors, and stress reduction education can also be very helpful. SEEK IMMEDIATE MEDICAL CARE IF:  You have chest pain or tightness.  You have trouble breathing or feel short of breath.  You have weakness or leg numbness.  You feel confused or have trouble talking.  You have sudden changes in your vision. MAKE SURE YOU:  Understand these instructions.  Will watch your condition.  Will get help right away if you are not doing well or get worse. Document Released: 03/18/2004 Document Revised:  06/25/2013 Document Reviewed: 03/17/2011 [facility] Patient Information 2015 [place], [place]. This information is not intended to replace advice given to you by your health care provider. Make sure you discuss any questions you have with your health care provider.

## 2014-04-30 NOTE — Progress Notes (Signed)
Subjective:    Patient ID: Gregory Stephenson, male    DOB: 07-10-1965, 49 y.o.   MRN: [id number]  HPI  Pre-visit discussion using our clinic review tool. No additional management support is needed unless otherwise documented below in the visit note.  Wt Readings from Last 3 Encounters:  04/30/14 254 lb (115.214 kg)  04/05/14 250 lb (113.399 kg)  02/12/13 236 lb (107.049 kg)    70  -year-old gentleman who is seen today for an annual exam. He has a four -year history of treated hypertension. He has no concerns or complaints. He discontinued tobacco use in 1997 and there is been some significant weight gain. His weight is presently 254. No other concerns or complaints. Past medical history is otherwise unremarkable. No hospitalizations or surgeries No allergies  Family history father died in his 40J complications of advanced COPD also may have had a cardiac condition Mother is 4 with a history of low back pain 5 brothers and 5 sisters positive for COPD asthma hypertension and diabetes Social history married one daughter and one grandson. Discontinued tobacco in 1997. 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 Hypertension     History   Social History  . Marital Status: Divorced    Spouse Name: N/A  . Number of Children: N/A  . Years of Education: N/A   Occupational History  . Not on file.   Social History Main Topics  . Smoking status: Former Smoker    Types: Cigarettes    Quit date: 11/23/1995  . Smokeless tobacco: Never Used  . Alcohol Use: No  . Drug Use: No  . Sexual Activity:    Partners: Female   Other Topics Concern  . Not on file   Social History Narrative    No past surgical history on file.  No family history on file.  No Known Allergies  Current Outpatient Prescriptions on File Prior to Visit  Medication Sig Dispense Refill  . amLODipine (NORVASC) 10 MG tablet Take 1 tablet (10 mg total) by mouth  daily. 90 tablet 0  . atorvastatin (LIPITOR) 40 MG tablet Take 1 tablet (40 mg total) by mouth daily. 30 tablet 3  . doxazosin (CARDURA) 4 MG tablet Take 1 tablet (4 mg total) by mouth at bedtime. 90 tablet 3   No current facility-administered medications on file prior to visit.    BP 160/100 mmHg  Pulse 92  Temp(Src) 98.5 F (36.9 C) (Oral)  Resp 20  Ht 5\' 6"  (1.676 m)  Wt 254 lb (115.214 kg)  BMI 41.02 kg/m2  SpO2 98%       Review of Systems  Constitutional: Negative for fever, chills, activity change, appetite change and fatigue.  HENT: Negative for congestion, dental problem, ear pain, hearing loss, mouth sores, rhinorrhea, sinus pressure, sneezing, tinnitus, trouble swallowing and voice change.   Eyes: Negative for photophobia, pain, redness and visual disturbance.  Respiratory: Negative for apnea, cough, choking, chest tightness, shortness of breath and wheezing.   Cardiovascular: Negative for chest pain, palpitations and leg swelling.  Gastrointestinal: Negative for nausea, vomiting, abdominal pain, diarrhea, constipation, blood in stool, abdominal distention, anal bleeding and rectal pain.  Genitourinary: Negative for dysuria, urgency, frequency, hematuria, flank pain, decreased urine volume, discharge, penile swelling, scrotal swelling, difficulty urinating, genital sores and testicular pain.  Musculoskeletal: Negative for myalgias, back pain, joint swelling, arthralgias, gait problem, neck pain and neck stiffness.  Skin: Negative for color change,  rash and wound.  Neurological: Negative for dizziness, tremors, seizures, syncope, facial asymmetry, speech difficulty, weakness, light-headedness, numbness and headaches.  Hematological: Negative for adenopathy. Does not bruise/bleed easily.  Psychiatric/Behavioral: Negative for suicidal ideas, hallucinations, behavioral problems, confusion, sleep disturbance, self-injury, dysphoric mood, decreased concentration and agitation.  The patient is not nervous/anxious.    Positive for weight gain, daytime sleepiness.  History of loud snoring but has diminished somewhat more recently, according to his spouse     Objective:   Physical Exam  Constitutional: He appears well-developed and well-nourished.  Obese Blood pressure 140/100  HENT:  Head: Normocephalic and atraumatic.  Right Ear: External ear normal.  Left Ear: External ear normal.  Nose: Nose normal.  Mouth/Throat: Oropharynx is clear and moist.  Pharyngeal crowding  Eyes: Conjunctivae and EOM are normal. Pupils are equal, round, and reactive to light. No scleral icterus.  Neck: Normal range of motion. Neck supple. No JVD present. No thyromegaly present.  Cardiovascular: Regular rhythm, normal heart sounds and intact distal pulses.  Exam reveals no gallop and no friction rub.   No murmur heard. Pulmonary/Chest: Effort normal and breath sounds normal. He exhibits no tenderness.  Abdominal: Soft. Bowel sounds are normal. He exhibits no distension and no mass. There is no tenderness.  Genitourinary: Prostate normal and penis normal. Guaiac negative stool.  Uncircumcised  Musculoskeletal: Normal range of motion. He exhibits no edema or tenderness.  Lymphadenopathy:    He has no cervical adenopathy.  Neurological: He is alert. He has normal reflexes. No cranial nerve deficit. Coordination normal.  Skin: Skin is warm and dry. No rash noted.  Psychiatric: He has a normal mood and affect. His behavior is normal.          Assessment & Plan:  Preventive health exam.  Schedule colonoscopy Hypertension.  Suboptimal control.  Will add hydrochlorothiazide 25.  Recheck 6 weeks Obesity. Weight loss exercise encouraged. Home blood pressure monitoring encouraged Rule out OSA.  Will set up for home sleep study  6 weeks.  Recheck Laboratory update obtained today

## 2014-04-30 NOTE — Progress Notes (Signed)
Pre visit review using our clinic review tool, if applicable. No additional management support is needed unless otherwise documented below in the visit note. 

## 2014-05-01 ENCOUNTER — Encounter: Payer: Self-pay | Admitting: Gastroenterology

## 2014-06-18 ENCOUNTER — Ambulatory Visit (AMBULATORY_SURGERY_CENTER): Payer: Self-pay | Admitting: *Deleted

## 2014-06-18 VITALS — Ht 67.0 in | Wt 244.8 lb

## 2014-06-18 DIAGNOSIS — Z1211 Encounter for screening for malignant neoplasm of colon: Secondary | ICD-10-CM

## 2014-06-18 MED ORDER — NA SULFATE-K SULFATE-MG SULF 17.5-3.13-1.6 GM/177ML PO SOLN: 1.0000 | Freq: Once | ORAL | Status: DC

## 2014-06-18 NOTE — Progress Notes (Signed)
No egg or soy allergy No home 02  No diet pills No past sedation ever emmi video to e mail

## 2014-06-21 ENCOUNTER — Ambulatory Visit (INDEPENDENT_AMBULATORY_CARE_PROVIDER_SITE_OTHER): Payer: BC Managed Care – PPO | Admitting: Pulmonary Disease

## 2014-06-21 ENCOUNTER — Encounter: Payer: Self-pay | Admitting: Pulmonary Disease

## 2014-06-21 ENCOUNTER — Encounter (INDEPENDENT_AMBULATORY_CARE_PROVIDER_SITE_OTHER): Payer: Self-pay

## 2014-06-21 VITALS — Temp 98.1°F | Ht 67.0 in | Wt 252.4 lb

## 2014-06-21 DIAGNOSIS — G4733 Obstructive sleep apnea (adult) (pediatric): Secondary | ICD-10-CM | POA: Diagnosis not present

## 2014-06-21 HISTORY — DX: Obstructive sleep apnea (adult) (pediatric): G47.33

## 2014-06-21 NOTE — Assessment & Plan Note (Signed)
Given excessive daytime somnolence, narrow pharyngeal exam, witnessed apneas & loud snoring, obstructive sleep apnea is very likely & an overnight polysomnogram will be scheduled as a home study. The pathophysiology of obstructive sleep apnea , it's cardiovascular consequences & modes of treatment including CPAP were discused with the patient in detail & they evidenced understanding.  Pretest probability is high 

## 2014-06-21 NOTE — Progress Notes (Signed)
Subjective:    Patient ID: Gregory Stephenson, male    DOB: Oct 02, 1965, 49 y.o.   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 He feels this may be due to insufficient quantity of sleep. Loud snoring has been noted by his wife in the past, he states that he used organic cider and his wife has not complained recently. He denies witnessed apneas or gasping or choking episodes in his sleep. Bedtime is around 11 PM, he falls asleep instantly, sleeps on his right side with 2 pillows, reports nocturia with 4-5 nocturnal awakenings and is out of bed by 5 AM feeling tired with occasional dryness of mouth, denies headache. Denies excessive use of caffeinated beverages. He lost about 30 pounds after going on a diet-but gained it all back Weight loss encouraged, compliance with goal of at least 4-6 hrs every night is the expectation. Advised against medications with sedative side effects Cautioned against driving when sleepy - understanding that sleepiness will vary on a day to day basis  Chief Complaint  Patient presents with  . Sleep Consult    Referred by Dr. Burnice Logan; snores very loud when he lost weight, his snoring got better; told that he stops breathing during sleep; Dr. Raliegh Ip told patient his glands were enlarged in his throat.  Sleeps well, doesn't have trouble with sleep. Epworth Score: 16     Past Medical History  Diagnosis Date  . Hypertension   . Hyperlipidemia     Past Surgical History  Procedure Laterality Date  . No past surgeries      No Known Allergies  History   Social History  . Marital Status: Divorced    Spouse Name: N/A  . Number of Children: N/A  . Years of Education: N/A   Occupational History  . Not on file.   Social History Main  Topics  . Smoking status: Former Smoker    Types: Cigarettes    Quit date: 11/23/1995  . Smokeless tobacco: Never Used  . Alcohol Use: No  . Drug Use: No  . Sexual Activity:    Partners: Female   Other Topics Concern  . Not on file   Social History Narrative    Family History  Problem Relation Age of Onset  . Breast cancer Sister   . Colon cancer Neg Hx   . Breast cancer Sister      Review of Systems  Constitutional: Negative for fever, chills, activity change, appetite change and unexpected weight change.  HENT: Negative for congestion, dental problem, postnasal drip, rhinorrhea, sneezing, sore throat, trouble swallowing and voice change.   Eyes: Negative for visual disturbance.  Respiratory: Negative for cough, choking and shortness of breath.   Cardiovascular: Negative for chest pain and leg swelling.  Gastrointestinal: Negative for nausea, vomiting and abdominal pain.  Genitourinary: Negative for difficulty urinating.  Musculoskeletal: Negative for arthralgias.  Skin: Negative for rash.  Psychiatric/Behavioral: Negative for behavioral problems and confusion.       Objective:   Physical Exam  Gen. Pleasant, obese, in no distress, normal affect ENT - no lesions, no post nasal drip, class 2-3 airway Neck: No JVD, no thyromegaly, no carotid bruits Lungs: no use of accessory muscles, no dullness to percussion, decreased without rales or rhonchi  Cardiovascular: Rhythm regular, heart sounds  normal, no murmurs or gallops, no peripheral edema Abdomen: soft and non-tender, no hepatosplenomegaly, BS normal. Musculoskeletal: No deformities, no cyanosis or clubbing Neuro:  alert, non focal, no tremors       Assessment & Plan:

## 2014-06-21 NOTE — Patient Instructions (Signed)
Home sleep test 

## 2014-07-01 ENCOUNTER — Encounter: Payer: Self-pay | Admitting: Gastroenterology

## 2014-07-01 ENCOUNTER — Ambulatory Visit (AMBULATORY_SURGERY_CENTER): Payer: BC Managed Care – PPO | Admitting: Gastroenterology

## 2014-07-01 ENCOUNTER — Other Ambulatory Visit: Payer: Self-pay | Admitting: Gastroenterology

## 2014-07-01 VITALS — BP 131/75 | HR 72 | Temp 97.0°F | Resp 30 | Ht 67.0 in | Wt 244.0 lb

## 2014-07-01 DIAGNOSIS — Z1211 Encounter for screening for malignant neoplasm of colon: Secondary | ICD-10-CM | POA: Diagnosis not present

## 2014-07-01 DIAGNOSIS — D123 Benign neoplasm of transverse colon: Secondary | ICD-10-CM

## 2014-07-01 MED ORDER — SODIUM CHLORIDE 0.9 % IV SOLN: 500.0000 mL | INTRAVENOUS | Status: DC

## 2014-07-01 NOTE — Progress Notes (Signed)
Called to room to assist during endoscopic procedure.  Patient ID and intended procedure confirmed with present staff. Received instructions for my participation in the procedure from the performing physician.  

## 2014-07-01 NOTE — Op Note (Addendum)
[place]  [address] [place], [id number]   COLONOSCOPY PROCEDURE REPORT  PATIENT: Gregory Stephenson, Gregory Stephenson  MR#: [id number] BIRTHDATE: [birth year] , 41  yrs. old GENDER: male ENDOSCOPIST: Ladene Artist, MD, [facility] REFERRED QP:YPPJK Burnice Logan, M.D. PROCEDURE DATE:  07/01/2014 PROCEDURE:   Colonoscopy, screening and Colonoscopy with biopsy First Screening Colonoscopy - Avg.  risk and is 50 yrs.  old or older Yes.  Prior Negative Screening - Now for repeat screening. N/A  History of Adenoma - Now for follow-up colonoscopy & has been > or = to 3 yrs.  N/A  Polyps Removed Today ASA CLASS:   Class II INDICATIONS:Screening for colonic neoplasia and Colorectal Neoplasm Risk Assessment for this procedure is average risk. MEDICATIONS: Monitored anesthesia care and Propofol 250 mg IV DESCRIPTION OF PROCEDURE:   After the risks benefits and alternatives of the procedure were thoroughly explained, informed consent was obtained.  The digital rectal exam revealed no abnormalities of the rectum.   The LB [id number]  endoscope was introduced through the anus and advanced to the cecum, which was identified by both the appendix and ileocecal valve. No adverse events experienced with a tortuous colon.   The quality of the prep was good.  (Suprep was used)  The instrument was then slowly withdrawn as the colon was fully examined.    COLON FINDINGS: A sessile polyp measuring 3 mm in size was found in the transverse colon.  A polypectomy was performed with cold forceps.  The resection was complete, the polyp tissue was completely retrieved and sent to histology.   The examination was otherwise normal.  Retroflexed views revealed internal Grade I hemorrhoids. The time to cecum = 4.1 Withdrawal time = 11.9   The scope was withdrawn and the procedure completed. COMPLICATIONS: There were no immediate complications.  ENDOSCOPIC IMPRESSION: 1.   Sessile polyp in the transverse colon;  polypectomy performed with cold forceps 2.   Grade l internal hemorrhoids  RECOMMENDATIONS: 1.  Await pathology results 2.  Repeat colonoscopy in 5 years if polyp adenomatous; otherwise 10 years  eSigned:  Ladene Artist, MD, [facility] 07/01/2014 10:54 AM

## 2014-07-01 NOTE — Progress Notes (Signed)
To recovery, report to Hodges, RN, VSS 

## 2014-07-01 NOTE — Patient Instructions (Signed)
YOU HAD AN ENDOSCOPIC PROCEDURE TODAY AT [facility]:   Refer to the procedure report that was given to you for any specific questions about what was found during the examination.  If the procedure report does not answer your questions, please call your gastroenterologist to clarify.  If you requested that your care partner not be given the details of your procedure findings, then the procedure report has been included in a sealed envelope for you to review at your convenience later.  YOU SHOULD EXPECT: Some feelings of bloating in the abdomen. Passage of more gas than usual.  Walking can help get rid of the air that was put into your GI tract during the procedure and reduce the bloating. If you had a lower endoscopy (such as a colonoscopy or flexible sigmoidoscopy) you may notice spotting of blood in your stool or on the toilet paper. If you underwent a bowel prep for your procedure, you may not have a normal bowel movement for a few days.  Please Note:  You might notice some irritation and congestion in your nose or some drainage.  This is from the oxygen used during your procedure.  There is no need for concern and it should clear up in a day or so.  SYMPTOMS TO REPORT IMMEDIATELY:   Following lower endoscopy (colonoscopy or flexible sigmoidoscopy):  Excessive amounts of blood in the stool  Significant tenderness or worsening of abdominal pains  Swelling of the abdomen that is new, acute  Fever of 100F or higher   For urgent or emergent issues, a gastroenterologist can be reached at any hour by calling [phone].   DIET: Your first meal following the procedure should be a small meal and then it is ok to progress to your normal diet. Heavy or fried foods are harder to digest and may make you feel nauseous or bloated.  Likewise, meals heavy in dairy and vegetables can increase bloating.  Drink plenty of fluids but you should avoid alcoholic beverages for 24  hours.  ACTIVITY:  You should plan to take it easy for the rest of today and you should NOT DRIVE or use heavy machinery until tomorrow (because of the sedation medicines used during the test).    FOLLOW UP: Our staff will call the number listed on your records the next business day following your procedure to check on you and address any questions or concerns that you may have regarding the information given to you following your procedure. If we do not reach you, we will leave a message.  However, if you are feeling well and you are not experiencing any problems, there is no need to return our call.  We will assume that you have returned to your regular daily activities without incident.  If any biopsies were taken you will be contacted by phone or by letter within the next 1-3 weeks.  Please call us at [phone] if you have not heard about the biopsies in 3 weeks.    SIGNATURES/CONFIDENTIALITY: You and/or your care partner have signed paperwork which will be entered into your electronic medical record.  These signatures attest to the fact that that the information above on your After Visit Summary has been reviewed and is understood.  Full responsibility of the confidentiality of this discharge information lies with you and/or your care-partner.  Read the handouts given to you by your recovery room nurse.

## 2014-07-02 ENCOUNTER — Telehealth: Payer: Self-pay | Admitting: *Deleted

## 2014-07-02 NOTE — Telephone Encounter (Signed)
No answer. Name identifier. Message left to call if any questions or concerns. 

## 2014-07-08 ENCOUNTER — Ambulatory Visit: Payer: Self-pay | Admitting: Internal Medicine

## 2014-07-09 ENCOUNTER — Encounter: Payer: Self-pay | Admitting: Gastroenterology

## 2014-07-16 DIAGNOSIS — G4730 Sleep apnea, unspecified: Secondary | ICD-10-CM | POA: Diagnosis not present

## 2014-07-19 ENCOUNTER — Telehealth: Payer: Self-pay | Admitting: Pulmonary Disease

## 2014-07-19 ENCOUNTER — Encounter: Payer: Self-pay | Admitting: Pulmonary Disease

## 2014-07-19 DIAGNOSIS — G4730 Sleep apnea, unspecified: Secondary | ICD-10-CM

## 2014-07-19 DIAGNOSIS — G4733 Obstructive sleep apnea (adult) (pediatric): Secondary | ICD-10-CM | POA: Diagnosis not present

## 2014-07-19 NOTE — Telephone Encounter (Signed)
Severe Sleep Apnea 88/hr CPAP titration Study recommended ASAP

## 2014-07-19 NOTE — Telephone Encounter (Signed)
Called and spoke to patient, CPAP titration Study ordered ASAP.  Nothing further needed.

## 2014-10-03 ENCOUNTER — Encounter (HOSPITAL_BASED_OUTPATIENT_CLINIC_OR_DEPARTMENT_OTHER): Payer: Self-pay

## 2014-11-04 ENCOUNTER — Ambulatory Visit (INDEPENDENT_AMBULATORY_CARE_PROVIDER_SITE_OTHER): Payer: BC Managed Care – PPO | Admitting: Internal Medicine

## 2014-11-04 ENCOUNTER — Encounter: Payer: Self-pay | Admitting: Internal Medicine

## 2014-11-04 VITALS — BP 130/90 | HR 72 | Temp 98.4°F | Ht 67.0 in | Wt 239.0 lb

## 2014-11-04 DIAGNOSIS — I10 Essential (primary) hypertension: Secondary | ICD-10-CM

## 2014-11-04 DIAGNOSIS — E6609 Other obesity due to excess calories: Secondary | ICD-10-CM

## 2014-11-04 DIAGNOSIS — K1120 Sialoadenitis, unspecified: Secondary | ICD-10-CM | POA: Diagnosis not present

## 2014-11-04 DIAGNOSIS — E669 Obesity, unspecified: Secondary | ICD-10-CM

## 2014-11-04 DIAGNOSIS — K115 Sialolithiasis: Secondary | ICD-10-CM

## 2014-11-04 DIAGNOSIS — Z23 Encounter for immunization: Secondary | ICD-10-CM

## 2014-11-04 DIAGNOSIS — G4733 Obstructive sleep apnea (adult) (pediatric): Secondary | ICD-10-CM | POA: Diagnosis not present

## 2014-11-04 NOTE — Patient Instructions (Addendum)
Parotitis Parotitis is soreness and inflammation of one or both parotid glands. The parotid glands produce saliva. They are located on each side of the face, below and in front of the earlobes. The saliva produced comes out of tiny openings (ducts) inside the cheeks. In most cases, parotitis goes away over time or with treatment. If your parotitis is caused by certain long-term (chronic) diseases, it may come back again.  CAUSES  Parotitis can be caused by:  Viral infections. Mumps is one viral infection that can cause parotitis.  Bacterial infections.  Blockage of the salivary ducts due to a salivary stone.  Narrowing of the salivary ducts.  Swelling of the salivary ducts.  Dehydration.  Autoimmune conditions, such as sarcoidosis or Sjogren syndrome.  Air from activities such as scuba diving, glass blowing, or playing an instrument (rare).  Human immunodeficiency virus (HIV) or acquired immunodeficiency syndrome (AIDS).  Tuberculosis. SIGNS AND SYMPTOMS   The ears may appear to be pushed up and out from their normal position.  Redness (erythema) of the skin over the parotid glands.  Pain and tenderness over the parotid glands.  Swelling in the parotid gland area.  Yellowish-white fluid (pus) coming from the ducts inside the cheeks.  Dry mouth.  Bad taste in the mouth. DIAGNOSIS  Your health care provider may determine that you have parotitis based on your symptoms and a physical exam. A sample of fluid may also be taken from the parotid gland and tested to find the cause of your infection. X-rays or computed tomography (CT) scans may be taken if your health care provider thinks you might have a salivary stone blocking your salivary duct. TREATMENT  Treatment varies depending upon the cause of your parotitis. If your parotitis is caused by mumps, no treatment is needed. The condition will go away on its own after 7 to 10 days. In other cases, treatment may  include:  Antibiotic medicine if your infection was caused by bacteria.  Pain medicines.  Gland massage.  Eating sour candy to increase your saliva production.  Removal of salivary stones. Your health care provider may flush stones out with fluids or remove them with tweezers.  Surgery to remove the parotid glands. HOME CARE INSTRUCTIONS   If you were prescribed an antibiotic medicine, finish it all even if you start to feel better.  Put warm compresses on the sore area.  Take medicines only as directed by your health care provider.  Drink enough fluids to keep your urine clear or pale yellow. SEEK IMMEDIATE MEDICAL CARE IF:   You have increasing pain or swelling that is not controlled with medicine.  You have a fever. MAKE SURE YOU:  Understand these instructions.  Will watch your condition.  Will get help right away if you are not doing well or get worse. Document Released: 07/31/2001 Document Revised: 06/25/2013 Document Reviewed: 01/04/2011 [facility] Patient Information 2015 [place], [place]. This information is not intended to replace advice given to you by your health care provider. Make sure you discuss any questions you have with your health care provider.   Return in 2 weeks for follow-up if mass in the left neck area has not resolved

## 2014-11-04 NOTE — Addendum Note (Signed)
Addended by: Ailene Rud E on: 11/04/2014 04:40 PM   Modules accepted: Orders

## 2014-11-04 NOTE — Progress Notes (Signed)
Subjective:    Patient ID: Gregory Stephenson, male    DOB: [birth year], 49 y.o.   MRN: [id number]  HPI  Wt Readings from Last 3 Encounters:  11/04/14 239 lb (108.41 kg)  07/01/14 244 lb (110.678 kg)  06/21/14 252 lb 6.4 oz (114.29 kg)    49 year old patient who has a history of obesity, essential hypertension.  He is scheduled for pulmonary follow-up for OSA.  He states that he has had a recent 20 pound voluntary weight loss with better diet and working with a Physiological scientist.  His wife states that his snoring has diminished with the weight loss.  He presents with a two-month history of a painless mass in the left neck area.  The mass has been static over this two-month period.  No constitutional complaints.  Former smoker but not in 20 years No pulmonary complaints  Past Medical History  Diagnosis Date  . Hypertension   . Hyperlipidemia   . OSA (obstructive sleep apnea) 06/21/2014    Social History   Social History  . Marital Status: Divorced    Spouse Name: N/A  . Number of Children: N/A  . Years of Education: N/A   Occupational History  . Not on file.   Social History Main Topics  . Smoking status: Former Smoker    Types: Cigarettes    Quit date: 11/23/1995  . Smokeless tobacco: Never Used  . Alcohol Use: No  . Drug Use: No  . Sexual Activity:    Partners: Female   Other Topics Concern  . Not on file   Social History Narrative    Past Surgical History  Procedure Laterality Date  . No past surgeries      Family History  Problem Relation Age of Onset  . Breast cancer Sister   . Colon cancer Neg Hx   . Breast cancer Sister     No Known Allergies  Current Outpatient Prescriptions on File Prior to Visit  Medication Sig Dispense Refill  . amLODipine (NORVASC) 10 MG tablet Take 1 tablet (10 mg total) by mouth daily. 90 tablet 1  . atorvastatin (LIPITOR) 40 MG tablet Take 1 tablet (40 mg total) by mouth daily. 90 tablet 1  .  lisinopril-hydrochlorothiazide (PRINZIDE,ZESTORETIC) 20-25 MG per tablet Take 1 tablet by mouth daily. 90 tablet 3   No current facility-administered medications on file prior to visit.    BP 130/90 mmHg  Pulse 72  Temp(Src) 98.4 F (36.9 C) (Oral)  Ht 5\' 7"  (1.702 m)  Wt 239 lb (108.41 kg)  BMI 37.42 kg/m2  SpO2 98%     Review of Systems  Constitutional: Negative for fever, chills, appetite change and fatigue.  HENT: Negative for congestion, dental problem, ear pain, hearing loss, sore throat, tinnitus, trouble swallowing and voice change.   Eyes: Negative for pain, discharge and visual disturbance.  Respiratory: Negative for cough, chest tightness, wheezing and stridor.   Cardiovascular: Negative for chest pain, palpitations and leg swelling.  Gastrointestinal: Negative for nausea, vomiting, abdominal pain, diarrhea, constipation, blood in stool and abdominal distention.  Genitourinary: Negative for urgency, hematuria, flank pain, discharge, difficulty urinating and genital sores.  Musculoskeletal: Negative for myalgias, back pain, joint swelling, arthralgias, gait problem and neck stiffness.  Skin: Negative for rash.  Neurological: Negative for dizziness, syncope, speech difficulty, weakness, numbness and headaches.  Hematological: Negative for adenopathy. Does not bruise/bleed easily.  Psychiatric/Behavioral: Negative for behavioral problems and dysphoric mood. The patient is not nervous/anxious.  Objective:   Physical Exam  Constitutional: He is oriented to person, place, and time. He appears well-developed.  HENT:  Head: Normocephalic.  Right Ear: External ear normal.  Left Ear: External ear normal.  Low hanging soft palate with pharyngeal crowding  Eyes: Conjunctivae and EOM are normal.  Neck: Normal range of motion. No thyromegaly present.  2-2.5 centimeter smooth, soft nodule in the left high submandibular area  Cardiovascular: Normal rate and normal heart  sounds.   Pulmonary/Chest: Breath sounds normal.  Abdominal: Bowel sounds are normal.  Musculoskeletal: Normal range of motion. He exhibits no edema or tenderness.  Neurological: He is alert and oriented to person, place, and time.  Psychiatric: He has a normal mood and affect. His behavior is normal.          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 We'll treat with warm compresses and observe at this time ENT referral if unchanged in one or 2 weeks  OSA.  Pulmonary follow-up as scheduled

## 2014-11-04 NOTE — Progress Notes (Signed)
Pre visit review using our clinic review tool, if applicable. No additional management support is needed unless otherwise documented below in the visit note. 

## 2014-11-07 ENCOUNTER — Ambulatory Visit (HOSPITAL_BASED_OUTPATIENT_CLINIC_OR_DEPARTMENT_OTHER): Payer: BC Managed Care – PPO | Attending: Pulmonary Disease

## 2014-11-07 VITALS — Ht 67.0 in | Wt 230.0 lb

## 2014-11-07 DIAGNOSIS — G4733 Obstructive sleep apnea (adult) (pediatric): Secondary | ICD-10-CM | POA: Diagnosis not present

## 2014-11-07 DIAGNOSIS — G4730 Sleep apnea, unspecified: Secondary | ICD-10-CM | POA: Diagnosis present

## 2014-11-14 ENCOUNTER — Telehealth: Payer: Self-pay | Admitting: Pulmonary Disease

## 2014-11-14 DIAGNOSIS — G4733 Obstructive sleep apnea (adult) (pediatric): Secondary | ICD-10-CM | POA: Diagnosis not present

## 2014-11-14 NOTE — Addendum Note (Signed)
Addended by: Rigoberto Noel on: 11/14/2014 08:19 AM   Modules accepted: Level of Service

## 2014-11-14 NOTE — Telephone Encounter (Signed)
Send Rx for  CPAP 9 cm H2O with a Medium size Fisher[employer]Paykel Full Face Mask Simplus mask and heated humidification. Download in 4 wks FU with TP/ me  in 6 wks

## 2014-11-14 NOTE — Telephone Encounter (Signed)
CPAP ordered Patient scheduled to see TP in 6 weeks Patient notified. Nothing further needed.

## 2014-11-14 NOTE — Progress Notes (Signed)
Patient Name: Ormand, Senn Date: 11/07/2014 Gender: Male D.O.B: 1965-04-26 Age (years): 10 Referring Sian Rockers: Kara Mead MD, ABSM Height (inches): 67 Interpreting Physician: Kara Mead MD, ABSM Weight (lbs): 230 RPSGT: Baxter Flattery BMI: 36 MRN: [id number] Neck Size: 19.00  CLINICAL INFORMATION The patient is referred for a CPAP titration to treat sleep apnea.  Date of  HST: 06/2014 showed AHI 88/h  SLEEP STUDY TECHNIQUE As per the AASM Manual for the Scoring of Sleep and Associated Events v2.3 (April 2016) with a hypopnea requiring 4% desaturations.  The channels recorded and monitored were frontal, central and occipital EEG, electrooculogram (EOG), submentalis EMG (chin), nasal and oral airflow, thoracic and abdominal wall motion, anterior tibialis EMG, snore microphone, electrocardiogram, and pulse oximetry. Continuous positive airway pressure (CPAP) was initiated at the beginning of the study and titrated to treat sleep-disordered breathing.  MEDICATIONS Medications administered by patient during sleep study : No sleep medicine administered.  TECHNICIAN COMMENTS Comments added by technician: Patient had more than two awakenings to use the bathroom    RESPIRATORY PARAMETERS Optimal PAP Pressure (cm): 9 AHI at Optimal Pressure (/hr): 0.0 Overall Minimal O2 (%): 92.00 Supine % at Optimal Pressure (%): 0 Minimal O2 at Optimal Pressure (%): 93.0    SLEEP ARCHITECTURE The study was initiated at 10:17:42 PM and ended at 4:27:01 AM.  Sleep onset time was 4.4 minutes and the sleep efficiency was 93.0%. The total sleep time was 343.5 minutes.  The patient spent 4.80% of the night in stage N1 sleep, 85.74% in stage N2 sleep, 0.00% in stage N3 and 9.46% in REM.Stage REM latency was 246.5 minutes  Wake after sleep onset was 21.4. Alpha intrusion was absent. Supine sleep was 0.00%.  CARDIAC DATA The 2 lead EKG demonstrated sinus rhythm. The mean heart rate was 49.21 beats  per minute. Other EKG findings include: None.  LEG MOVEMENT DATA The total Periodic Limb Movements of Sleep (PLMS) were 0. The PLMS index was 0.00. A PLMS index of <15 is considered normal in adults.  IMPRESSIONS The optimal PAP pressure was 9 cm of water. Central sleep apnea was not noted during this titration (CAI = 0.0/h). Significant oxygen desaturations were not observed during this titration (min O2 = 92.00%). No snoring was audible during this study. No cardiac abnormalities were observed during this study. Clinically significant periodic limb movements were not noted during this study. Arousals associated with PLMs were rare.  DIAGNOSIS Obstructive Sleep Apnea (327.23 [G47.33 ICD-10])  RECOMMENDATIONS Trial of CPAP therapy on 9 cm H2O with a Medium size Fisher&Paykel Full Face Mask Simplus mask and heated humidification. Avoid alcohol, sedatives and other CNS depressants that may worsen sleep apnea and disrupt normal sleep architecture. Sleep hygiene should be reviewed to assess factors that may improve sleep quality. Weight management and regular exercise should be initiated or continued. Return for re-evaluation after 4 weeks of therapy  Rigoberto Noel. MD

## 2014-12-26 ENCOUNTER — Encounter: Payer: Self-pay | Admitting: Adult Health

## 2014-12-26 ENCOUNTER — Ambulatory Visit (INDEPENDENT_AMBULATORY_CARE_PROVIDER_SITE_OTHER): Payer: BC Managed Care – PPO | Admitting: Adult Health

## 2014-12-26 ENCOUNTER — Ambulatory Visit: Payer: Self-pay | Admitting: Adult Health

## 2014-12-26 VITALS — BP 126/78 | HR 60 | Temp 98.7°F | Ht 67.0 in | Wt 235.0 lb

## 2014-12-26 DIAGNOSIS — E6609 Other obesity due to excess calories: Secondary | ICD-10-CM

## 2014-12-26 DIAGNOSIS — E669 Obesity, unspecified: Secondary | ICD-10-CM | POA: Diagnosis not present

## 2014-12-26 DIAGNOSIS — G4733 Obstructive sleep apnea (adult) (pediatric): Secondary | ICD-10-CM

## 2014-12-26 NOTE — Patient Instructions (Signed)
Keep up the good work. Continue on C Pap at that time Goal is to wear for at least 6 hours each night. Do not drive for sleepy Work on weight loss Follow-up Dr. Elsworth Soho in 1 year and as needed

## 2014-12-29 NOTE — Progress Notes (Signed)
   Subjective:    Patient ID: Gregory Stephenson, male    DOB: [birth year], 49 y.o.   MRN: [id number]  HPI 49 yo male with   TEST :  11/08/14 HST >AHI 88 /hr   12/26/14 Follow up : OSA  Patient returns for a follow-up for sleep apnea. He returns for a 6 month follow up. Uses CPAP on average 4-5 hours on nights he does not work, mask fits fine. Would like to get a order for new supplies.  Feels better on CPAP   Denies chest pain, orthopnea, edema or fever.  Download 10/4-11/2 shows excellent compliance with avg usage at ~5.5 hr at 9 cmH2O AHI 2.3, min leaks.    Review of Systems .Constitutional:   No  weight loss, night sweats,  Fevers, chills, + fatigue, or  lassitude.  HEENT:   No headaches,  Difficulty swallowing,  Tooth/dental problems, or  Sore throat,                No sneezing, itching, ear ache, nasal congestion, post nasal drip,   CV:  No chest pain,  Orthopnea, PND, swelling in lower extremities, anasarca, dizziness, palpitations, syncope.   GI  No heartburn, indigestion, abdominal pain, nausea, vomiting, diarrhea, change in bowel habits, loss of appetite, bloody stools.   Resp: No shortness of breath with exertion or at rest.  No excess mucus, no productive cough,  No non-productive cough,  No coughing up of blood.  No change in color of mucus.  No wheezing.  No chest wall deformity  Skin: no rash or lesions.  GU: no dysuria, change in color of urine, no urgency or frequency.  No flank pain, no hematuria   MS:  No joint pain or swelling.  No decreased range of motion.  No back pain.  Psych:  No change in mood or affect. No depression or anxiety.  No memory loss.         Objective:   Physical Exam GEN: A/Ox3; pleasant , NAD, obese   HEENT:  [place]/AT,  EACs-clear, TMs-wnl, NOSE-clear, THROAT-clear, no lesions, no postnasal drip or exudate noted. Class 2-3 MP airway   NECK:  Supple w/ fair ROM; no JVD; normal carotid impulses w/o bruits; no thyromegaly or nodules  palpated; no lymphadenopathy.  RESP  Clear  P & A; w/o, wheezes/ rales/ or rhonchi.no accessory muscle use, no dullness to percussion  CARD:  RRR, no m/r/g  , no peripheral edema, pulses intact, no cyanosis or clubbing.  GI:   Soft & nt; nml bowel sounds; no organomegaly or masses detected.  Musco: Warm bil, no deformities or joint swelling noted.   Neuro: alert, no focal deficits noted.    Skin: Warm, no lesions or rashes         Assessment & Plan:

## 2014-12-31 NOTE — Assessment & Plan Note (Signed)
Wt loss  

## 2014-12-31 NOTE — Assessment & Plan Note (Addendum)
Severe OSA w/ good control on CPAP   Plan  Keep up the good work. Continue on C Pap at that time Goal is to wear for at least 6 hours each night. Do not drive for sleepy Work on weight loss Follow-up Dr. Elsworth Soho in 1 year and as needed

## 2015-01-22 ENCOUNTER — Encounter: Payer: Self-pay | Admitting: Adult Health

## 2015-04-04 ENCOUNTER — Other Ambulatory Visit: Payer: Self-pay | Admitting: Internal Medicine

## 2015-04-04 MED ORDER — ATORVASTATIN CALCIUM 40 MG PO TABS: 40.0000 mg | ORAL_TABLET | Freq: Every day | ORAL | Status: DC

## 2015-05-22 ENCOUNTER — Other Ambulatory Visit: Payer: Self-pay | Admitting: Internal Medicine

## 2015-08-25 ENCOUNTER — Encounter: Payer: Self-pay | Admitting: Internal Medicine

## 2015-08-29 ENCOUNTER — Encounter: Payer: Self-pay | Admitting: Internal Medicine

## 2015-08-29 ENCOUNTER — Ambulatory Visit (INDEPENDENT_AMBULATORY_CARE_PROVIDER_SITE_OTHER): Payer: BC Managed Care – PPO | Admitting: Internal Medicine

## 2015-08-29 VITALS — BP 142/90 | HR 80 | Temp 98.4°F | Ht 67.25 in | Wt 248.0 lb

## 2015-08-29 DIAGNOSIS — E669 Obesity, unspecified: Secondary | ICD-10-CM

## 2015-08-29 DIAGNOSIS — Z0000 Encounter for general adult medical examination without abnormal findings: Secondary | ICD-10-CM

## 2015-08-29 DIAGNOSIS — I10 Essential (primary) hypertension: Secondary | ICD-10-CM | POA: Diagnosis not present

## 2015-08-29 DIAGNOSIS — G4733 Obstructive sleep apnea (adult) (pediatric): Secondary | ICD-10-CM

## 2015-08-29 DIAGNOSIS — Z86010 Personal history of colonic polyps: Secondary | ICD-10-CM | POA: Insufficient documentation

## 2015-08-29 DIAGNOSIS — E6609 Other obesity due to excess calories: Secondary | ICD-10-CM

## 2015-08-29 LAB — COMPREHENSIVE METABOLIC PANEL
ALBUMIN: 4.7 g/dL (ref 3.5–5.2)
ALK PHOS: 65 U/L (ref 39–117)
ALT: 36 U/L (ref 0–53)
AST: 28 U/L (ref 0–37)
BILIRUBIN TOTAL: 0.6 mg/dL (ref 0.2–1.2)
BUN: 13 mg/dL (ref 6–23)
CALCIUM: 9.5 mg/dL (ref 8.4–10.5)
CO2: 31 mEq/L (ref 19–32)
CREATININE: 1.2 mg/dL (ref 0.40–1.50)
Chloride: 105 mEq/L (ref 96–112)
GFR: 81.74 mL/min (ref >60.00)
Glucose, Bld: 111 mg/dL — ABNORMAL HIGH (ref 70–99)
Potassium: 4.1 mEq/L (ref 3.5–5.1)
Sodium: 140 mEq/L (ref 135–145)
TOTAL PROTEIN: 7.2 g/dL (ref 6.0–8.3)

## 2015-08-29 LAB — CBC WITH DIFFERENTIAL/PLATELET
BASOS ABS: 0 10*3/uL (ref 0.0–0.1)
Basophils Relative: 0.4 % (ref 0.0–3.0)
EOS ABS: 0.2 10*3/uL (ref 0.0–0.7)
Eosinophils Relative: 3.4 % (ref 0.0–5.0)
HEMATOCRIT: 43.8 % (ref 39.0–52.0)
Hemoglobin: 14.5 g/dL (ref 13.0–17.0)
LYMPHS PCT: 39.9 % (ref 12.0–46.0)
Lymphs Abs: 2.2 10*3/uL (ref 0.7–4.0)
MCHC: 33.1 g/dL (ref 30.0–36.0)
MCV: 91.1 fl (ref 78.0–100.0)
MONOS PCT: 6 % (ref 3.0–12.0)
Monocytes Absolute: 0.3 10*3/uL (ref 0.1–1.0)
NEUTROS ABS: 2.7 10*3/uL (ref 1.4–7.7)
Neutrophils Relative %: 50.3 % (ref 43.0–77.0)
PLATELETS: 275 10*3/uL (ref 150.0–400.0)
RBC: 4.8 Mil/uL (ref 4.22–5.81)
RDW: 13.4 % (ref 11.5–15.5)
WBC: 5.4 10*3/uL (ref 4.0–10.5)

## 2015-08-29 LAB — LIPID PANEL
Cholesterol: 184 mg/dL (ref 0–200)
HDL: 35 mg/dL — AB (ref >39.00)
LDL Cholesterol: 131 mg/dL — ABNORMAL HIGH (ref 0–99)
NonHDL: 149.26
TRIGLYCERIDES: 92 mg/dL (ref 0.0–149.0)
Total CHOL/HDL Ratio: 5
VLDL: 18.4 mg/dL (ref 0.0–40.0)

## 2015-08-29 LAB — PSA: PSA: 1 ng/mL (ref 0.10–4.00)

## 2015-08-29 LAB — TSH: TSH: 1.08 u[IU]/mL (ref 0.35–4.50)

## 2015-08-29 NOTE — Progress Notes (Signed)
Subjective:    Patient ID: Gregory Stephenson, male    DOB: Oct 18, 1965, 50 y.o.   MRN: [id number]  HPI  Pre-visit discussion using our clinic review tool. No additional management support is needed unless otherwise documented below in the visit note.  Wt Readings from Last 3 Encounters:  08/29/15 248 lb (112.492 kg)  12/26/14 235 lb (106.595 kg)  11/07/14 230 lb (104.327 kg)    50   -year-old gentleman who is seen today for an annual exam. He has a five  -year history of treated hypertension. He has no concerns or complaints. He discontinued tobacco use in 1997 and there is been some significant weight gain. His weight is presently 248. No other concerns or complaints.  He is now followed by pulmonary medicine for severe OSA and doing quite well on CPAP  Past medical history is otherwise unremarkable. No hospitalizations or surgeries No allergies  Family history father died in his [id number] complications of advanced COPD also may have had a cardiac condition Mother is 52 with a history of low back pain 5 brothers and 5 sisters positive for COPD asthma hypertension and diabetes Social history married one daughter and one grandson. Discontinued tobacco in 1997. 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 Hypertension   . Hyperlipidemia   . OSA (obstructive sleep apnea) 06/21/2014    Social History   Social History  . Marital Status: Divorced    Spouse Name: N/A  . Number of Children: N/A  . Years of Education: N/A   Occupational History  . Not on file.   Social History Main Topics  . Smoking status: Former Smoker    Types: Cigarettes    Quit date: 11/23/1995  . Smokeless tobacco: Never Used  . Alcohol Use: No  . Drug Use: No  . Sexual Activity:    Partners: Female   Other Topics Concern  . Not on file   Social History Narrative    Past Surgical History  Procedure Laterality Date  . No past surgeries      Family History   Problem Relation Age of Onset  . Breast cancer Sister   . Colon cancer Neg Hx   . Breast cancer Sister     No Known Allergies  Current Outpatient Prescriptions on File Prior to Visit  Medication Sig Dispense Refill  . amLODipine (NORVASC) 10 MG tablet TAKE 1 TABLET BY MOUTH EVERY DAY 90 tablet 1  . atorvastatin (LIPITOR) 40 MG tablet Take 1 tablet (40 mg total) by mouth daily. 90 tablet 1  . lisinopril-hydrochlorothiazide (PRINZIDE,ZESTORETIC) 20-25 MG per tablet Take 1 tablet by mouth daily. 90 tablet 3   No current facility-administered medications on file prior to visit.    BP 142/90 mmHg  Pulse 80  Temp(Src) 98.4 F (36.9 C) (Oral)  Ht 5' 7.25" (1.708 m)  Wt 248 lb (112.492 kg)  BMI 38.56 kg/m2  SpO2 98%       Review of Systems  Constitutional: Negative for fever, chills, activity change, appetite change and fatigue.  HENT: Negative for congestion, dental problem, ear pain, hearing loss, mouth sores, rhinorrhea, sinus pressure, sneezing, tinnitus, trouble swallowing and voice change.   Eyes: Negative for photophobia, pain, redness and visual disturbance.  Respiratory: Negative for apnea, cough, choking, chest tightness, shortness of breath and wheezing.   Cardiovascular: Negative for chest pain, palpitations and leg swelling.  Gastrointestinal: Negative for nausea, vomiting, abdominal pain, diarrhea, constipation,  blood in stool, abdominal distention, anal bleeding and rectal pain.  Genitourinary: Negative for dysuria, urgency, frequency, hematuria, flank pain, decreased urine volume, discharge, penile swelling, scrotal swelling, difficulty urinating, genital sores and testicular pain.  Musculoskeletal: Negative for myalgias, back pain, joint swelling, arthralgias, gait problem, neck pain and neck stiffness.  Skin: Negative for color change, rash and wound.  Neurological: Negative for dizziness, tremors, seizures, syncope, facial asymmetry, speech difficulty, weakness,  light-headedness, numbness and headaches.  Hematological: Negative for adenopathy. Does not bruise/bleed easily.  Psychiatric/Behavioral: Negative for suicidal ideas, hallucinations, behavioral problems, confusion, sleep disturbance, self-injury, dysphoric mood, decreased concentration and agitation. The patient is not nervous/anxious.    Positive for weight gain, daytime sleepiness.  History of loud snoring but has diminished somewhat more recently, according to his spouse     Objective:   Physical Exam  Constitutional: He appears well-developed and well-nourished.  Obese Blood pressure 1:30 over 90  HENT:  Head: Normocephalic and atraumatic.  Right Ear: External ear normal.  Left Ear: External ear normal.  Nose: Nose normal.  Mouth/Throat: Oropharynx is clear and moist.  Pharyngeal crowding  Eyes: Conjunctivae and EOM are normal. Pupils are equal, round, and reactive to light. No scleral icterus.  Neck: Normal range of motion. Neck supple. No JVD present. No thyromegaly present.  Cardiovascular: Regular rhythm, normal heart sounds and intact distal pulses.  Exam reveals no gallop and no friction rub.   No murmur heard. Pulmonary/Chest: Effort normal and breath sounds normal. He exhibits no tenderness.  Abdominal: Soft. Bowel sounds are normal. He exhibits no distension and no mass. There is no tenderness.  Genitourinary: Prostate normal and penis normal. Guaiac negative stool.  Uncircumcised  Musculoskeletal: Normal range of motion. He exhibits no edema or tenderness.  Lymphadenopathy:    He has no cervical adenopathy.  Neurological: He is alert. He has normal reflexes. No cranial nerve deficit. Coordination normal.  Skin: Skin is warm and dry. No rash noted.  Psychiatric: He has a normal mood and affect. His behavior is normal.          Assessment & Plan:  Preventive health exam.  Schedule colonoscopy At five-year intervals  History colonic polyps  Hypertension. Continue  present regimen  Obesity. Weight loss exercise encouraged. Home blood pressure monitoring encouraged Severe OSA.  Will continue CPAP and pulmonary follow-up    Laboratory update obtained today  Nyoka Cowden, MD

## 2015-08-29 NOTE — Progress Notes (Signed)
Pre visit review using our clinic review tool, if applicable. No additional management support is needed unless otherwise documented below in the visit note. 

## 2015-08-29 NOTE — Patient Instructions (Signed)

## 2015-08-30 LAB — HIV ANTIBODY (ROUTINE TESTING W REFLEX): HIV 1&2 Ab, 4th Generation: NONREACTIVE

## 2015-08-30 LAB — HEPATITIS C ANTIBODY: HCV Ab: NEGATIVE

## 2016-02-02 ENCOUNTER — Other Ambulatory Visit: Payer: Self-pay | Admitting: Internal Medicine

## 2016-04-08 ENCOUNTER — Ambulatory Visit (INDEPENDENT_AMBULATORY_CARE_PROVIDER_SITE_OTHER): Payer: BC Managed Care – PPO | Admitting: Internal Medicine

## 2016-04-08 ENCOUNTER — Encounter: Payer: Self-pay | Admitting: Internal Medicine

## 2016-04-08 VITALS — BP 152/78 | HR 97 | Temp 100.4°F | Ht 67.75 in | Wt 247.6 lb

## 2016-04-08 DIAGNOSIS — J111 Influenza due to unidentified influenza virus with other respiratory manifestations: Secondary | ICD-10-CM

## 2016-04-08 DIAGNOSIS — I10 Essential (primary) hypertension: Secondary | ICD-10-CM

## 2016-04-08 LAB — POCT INFLUENZA A/B
Influenza A, POC: POSITIVE — AB
Influenza B, POC: NEGATIVE

## 2016-04-08 MED ORDER — HYDROCODONE-ACETAMINOPHEN 5-325 MG PO TABS: 1.0000 | ORAL_TABLET | Freq: Four times a day (QID) | ORAL | 0 refills | Status: DC | PRN

## 2016-04-08 MED ORDER — OSELTAMIVIR PHOSPHATE 75 MG PO CAPS: 75.0000 mg | ORAL_CAPSULE | Freq: Two times a day (BID) | ORAL | 0 refills | Status: DC

## 2016-04-08 NOTE — Progress Notes (Signed)
Subjective:    Patient ID: Gregory Stephenson, male    DOB: 1965/06/09, 51 y.o.   MRN: [id number]  HPI  51 year old patient who is followed for essential hypertension and OSA.  He returned from work yesterday and had the abrupt onset of fever, chills, myalgias and cough He states that he did obtain a flu vaccine earlier in the season. No chest pain, wheezing or shortness of breath Cough is minimally productive  Past Medical History:  Diagnosis Date  . Hyperlipidemia   . Hypertension   . OSA (obstructive sleep apnea) 06/21/2014     Social History   Social History  . Marital status: Divorced    Spouse name: N/A  . Number of children: N/A  . Years of education: N/A   Occupational History  . Not on file.   Social History Main Topics  . Smoking status: Former Smoker    Types: Cigarettes    Quit date: 11/23/1995  . Smokeless tobacco: Never Used  . Alcohol use No  . Drug use: No  . Sexual activity: Yes    Partners: Female   Other Topics Concern  . Not on file   Social History Narrative  . No narrative on file    Past Surgical History:  Procedure Laterality Date  . NO PAST SURGERIES      Family History  Problem Relation Age of Onset  . Breast cancer Sister   . Colon cancer Neg Hx   . Breast cancer Sister     No Known Allergies  Current Outpatient Prescriptions on File Prior to Visit  Medication Sig Dispense Refill  . amLODipine (NORVASC) 10 MG tablet TAKE 1 TABLET BY MOUTH EVERY DAY 90 tablet 1  . atorvastatin (LIPITOR) 40 MG tablet TAKE 1 TABLET(40 MG) BY MOUTH DAILY 90 tablet 0   No current facility-administered medications on file prior to visit.     BP (!) 152/78 (BP Location: Left Arm, Patient Position: Sitting, Cuff Size: Normal)   Pulse 97   Temp (!) 100.4 F (38 C) (Oral)   Ht 5' 7.75" (1.721 m)   Wt 247 lb 9.6 oz (112.3 kg)   SpO2 96%   BMI 37.93 kg/m     Review of Systems  Constitutional: Positive for activity change, appetite change,  chills, fatigue and fever.  HENT: Negative for congestion, dental problem, ear pain, hearing loss, sore throat, tinnitus, trouble swallowing and voice change.   Eyes: Negative for pain, discharge and visual disturbance.  Respiratory: Positive for cough. Negative for chest tightness, wheezing and stridor.   Cardiovascular: Negative for chest pain, palpitations and leg swelling.  Gastrointestinal: Negative for abdominal distention, abdominal pain, blood in stool, constipation, diarrhea, nausea and vomiting.  Genitourinary: Negative for difficulty urinating, discharge, flank pain, genital sores, hematuria and urgency.  Musculoskeletal: Positive for myalgias. Negative for arthralgias, back pain, gait problem, joint swelling and neck stiffness.  Skin: Negative for rash.  Neurological: Negative for dizziness, syncope, speech difficulty, weakness, numbness and headaches.  Hematological: Negative for adenopathy. Does not bruise/bleed easily.  Psychiatric/Behavioral: Negative for behavioral problems and dysphoric mood. The patient is not nervous/anxious.        Objective:   Physical Exam  Constitutional: He is oriented to person, place, and time. He appears well-developed and well-nourished.  Overweight Appears weak and unwell but in no acute distress Temperature 100.4 O2 saturation 96% Pulse rate 97  HENT:  Head: Normocephalic.  Right Ear: External ear normal.  Left Ear: External ear normal.  Eyes: Conjunctivae and EOM are normal.  Neck: Normal range of motion.  Cardiovascular: Normal rate and normal heart sounds.   Pulmonary/Chest: Breath sounds normal. No respiratory distress. He has no wheezes. He has no rales.  Abdominal: Bowel sounds are normal.  Musculoskeletal: Normal range of motion. He exhibits no edema or tenderness.  Neurological: He is alert and oriented to person, place, and time.  Psychiatric: He has a normal mood and affect. His behavior is normal.          Assessment  & Plan:   Flu syndrome.  Will check POC for influenza  Symptomatic treatment Tamiflu if influenza A documented  Nyoka Cowden

## 2016-04-08 NOTE — Progress Notes (Signed)
Pre visit review using our clinic review tool, if applicable. No additional management support is needed unless otherwise documented below in the visit note. 

## 2016-04-08 NOTE — Patient Instructions (Addendum)
Drink as much fluid as you  can tolerate over the next few days  Take 400-600 mg of ibuprofen ( Advil, Motrin) with food every 4 to 6 hours as needed for pain relief or control of fever   Influenza, Adult Influenza ("the flu") is an infection in the lungs, nose, and throat (respiratory tract). It is caused by a virus. The flu causes many common cold symptoms, as well as a high fever and body aches. It can make you feel very sick. The flu spreads easily from person to person (is contagious). Getting a flu shot (influenza vaccination) every year is the best way to prevent the flu. Follow these instructions at home:  Take over-the-counter and prescription medicines only as told by your doctor.  Use a cool mist humidifier to add moisture (humidity) to the air in your home. This can make it easier to breathe.  Rest as needed.  Drink enough fluid to keep your pee (urine) clear or pale yellow.  Cover your mouth and nose when you cough or sneeze.  Wash your hands with soap and water often, especially after you cough or sneeze. If you cannot use soap and water, use hand sanitizer.  Stay home from work or school as told by your doctor. Unless you are visiting your doctor, try to avoid leaving home until your fever has been gone for 24 hours without the use of medicine.  Keep all follow-up visits as told by your doctor. This is important. How is this prevented?  Getting a yearly (annual) flu shot is the best way to avoid getting the flu. You may get the flu shot in late summer, fall, or winter. Ask your doctor when you should get your flu shot.  Wash your hands often or use hand sanitizer often.  Avoid contact with people who are sick during cold and flu season.  Eat healthy foods.  Drink plenty of fluids.  Get enough sleep.  Exercise regularly. Contact a doctor if:  You get new symptoms.  You have:  Chest pain.  Watery poop (diarrhea).  A fever.  Your cough gets  worse.  You start to have more mucus.  You feel sick to your stomach (nauseous).  You throw up (vomit). Get help right away if:  You start to be short of breath or have trouble breathing.  Your skin or nails turn a bluish color.  You have very bad pain or stiffness in your neck.  You get a sudden headache.  You get sudden pain in your face or ear.  You cannot stop throwing up. This information is not intended to replace advice given to you by your health care provider. Make sure you discuss any questions you have with your health care provider. Document Released: 11/18/2007 Document Revised: 07/17/2015 Document Reviewed: 12/03/2014 Elsevier Interactive Patient Education  2017 [employer].

## 2016-04-15 ENCOUNTER — Other Ambulatory Visit: Payer: Self-pay | Admitting: Internal Medicine

## 2016-07-26 ENCOUNTER — Other Ambulatory Visit: Payer: Self-pay | Admitting: Internal Medicine

## 2016-09-09 ENCOUNTER — Other Ambulatory Visit: Payer: Self-pay | Admitting: Internal Medicine

## 2016-10-19 ENCOUNTER — Other Ambulatory Visit: Payer: Self-pay | Admitting: Internal Medicine

## 2016-11-11 ENCOUNTER — Encounter: Payer: Self-pay | Admitting: Internal Medicine

## 2016-12-23 ENCOUNTER — Ambulatory Visit: Payer: BC Managed Care – PPO | Admitting: Internal Medicine

## 2016-12-23 DIAGNOSIS — Z0289 Encounter for other administrative examinations: Secondary | ICD-10-CM

## 2017-01-10 ENCOUNTER — Other Ambulatory Visit: Payer: Self-pay | Admitting: Internal Medicine

## 2017-03-29 ENCOUNTER — Other Ambulatory Visit: Payer: Self-pay | Admitting: Internal Medicine

## 2017-04-04 ENCOUNTER — Other Ambulatory Visit: Payer: Self-pay | Admitting: Internal Medicine

## 2017-04-22 ENCOUNTER — Other Ambulatory Visit: Payer: Self-pay | Admitting: Internal Medicine

## 2017-04-25 NOTE — Telephone Encounter (Signed)
Patient has CPE scheduled 05/10/17.

## 2017-05-09 ENCOUNTER — Ambulatory Visit (INDEPENDENT_AMBULATORY_CARE_PROVIDER_SITE_OTHER): Payer: BC Managed Care – PPO | Admitting: Internal Medicine

## 2017-05-09 ENCOUNTER — Encounter: Payer: Self-pay | Admitting: Internal Medicine

## 2017-05-09 VITALS — BP 130/76 | HR 76 | Temp 98.8°F | Ht 67.0 in | Wt 254.0 lb

## 2017-05-09 DIAGNOSIS — Z0000 Encounter for general adult medical examination without abnormal findings: Secondary | ICD-10-CM | POA: Diagnosis not present

## 2017-05-09 DIAGNOSIS — G4733 Obstructive sleep apnea (adult) (pediatric): Secondary | ICD-10-CM

## 2017-05-09 DIAGNOSIS — I10 Essential (primary) hypertension: Secondary | ICD-10-CM

## 2017-05-09 DIAGNOSIS — Z125 Encounter for screening for malignant neoplasm of prostate: Secondary | ICD-10-CM

## 2017-05-09 DIAGNOSIS — Z86010 Personal history of colonic polyps: Secondary | ICD-10-CM | POA: Diagnosis not present

## 2017-05-09 DIAGNOSIS — R7302 Impaired glucose tolerance (oral): Secondary | ICD-10-CM

## 2017-05-09 LAB — COMPREHENSIVE METABOLIC PANEL
ALT: 93 U/L — AB (ref 0–53)
AST: 49 U/L — ABNORMAL HIGH (ref 0–37)
Albumin: 4.6 g/dL (ref 3.5–5.2)
Alkaline Phosphatase: 76 U/L (ref 39–117)
BUN: 16 mg/dL (ref 6–23)
CO2: 30 meq/L (ref 19–32)
Calcium: 9.8 mg/dL (ref 8.4–10.5)
Chloride: 105 mEq/L (ref 96–112)
Creatinine, Ser: 1.25 mg/dL (ref 0.40–1.50)
GFR: 77.47 mL/min (ref >60.00)
GLUCOSE: 101 mg/dL — AB (ref 70–99)
POTASSIUM: 4 meq/L (ref 3.5–5.1)
Sodium: 143 mEq/L (ref 135–145)
Total Bilirubin: 0.9 mg/dL (ref 0.2–1.2)
Total Protein: 7.4 g/dL (ref 6.0–8.3)

## 2017-05-09 LAB — CBC WITH DIFFERENTIAL/PLATELET
BASOS PCT: 0.4 % (ref 0.0–3.0)
Basophils Absolute: 0 10*3/uL (ref 0.0–0.1)
EOS PCT: 1.6 % (ref 0.0–5.0)
Eosinophils Absolute: 0.1 10*3/uL (ref 0.0–0.7)
HEMATOCRIT: 43.6 % (ref 39.0–52.0)
HEMOGLOBIN: 14.2 g/dL (ref 13.0–17.0)
Lymphocytes Relative: 42.8 % (ref 12.0–46.0)
Lymphs Abs: 2.6 10*3/uL (ref 0.7–4.0)
MCHC: 32.7 g/dL (ref 30.0–36.0)
MCV: 93.3 fl (ref 78.0–100.0)
MONOS PCT: 7.4 % (ref 3.0–12.0)
Monocytes Absolute: 0.4 10*3/uL (ref 0.1–1.0)
Neutro Abs: 2.9 10*3/uL (ref 1.4–7.7)
Neutrophils Relative %: 47.8 % (ref 43.0–77.0)
Platelets: 255 10*3/uL (ref 150.0–400.0)
RBC: 4.67 Mil/uL (ref 4.22–5.81)
RDW: 13.4 % (ref 11.5–15.5)
WBC: 6 10*3/uL (ref 4.0–10.5)

## 2017-05-09 LAB — LIPID PANEL
CHOL/HDL RATIO: 4
Cholesterol: 138 mg/dL (ref 0–200)
HDL: 33.6 mg/dL — AB (ref >39.00)
LDL Cholesterol: 86 mg/dL (ref 0–99)
NONHDL: 103.91
Triglycerides: 91 mg/dL (ref 0.0–149.0)
VLDL: 18.2 mg/dL (ref 0.0–40.0)

## 2017-05-09 LAB — HEMOGLOBIN A1C: Hgb A1c MFr Bld: 6.3 % (ref 4.6–6.5)

## 2017-05-09 LAB — PSA: PSA: 1.34 ng/mL (ref 0.10–4.00)

## 2017-05-09 LAB — TSH: TSH: 1.11 u[IU]/mL (ref 0.35–4.50)

## 2017-05-09 MED ORDER — ATORVASTATIN CALCIUM 40 MG PO TABS: 40.0000 mg | ORAL_TABLET | Freq: Every day | ORAL | 4 refills | Status: AC

## 2017-05-09 MED ORDER — AMLODIPINE BESYLATE 10 MG PO TABS: 10.0000 mg | ORAL_TABLET | Freq: Every day | ORAL | 4 refills | Status: DC

## 2017-05-09 NOTE — Progress Notes (Signed)
Subjective:    Patient ID: Gregory Stephenson, male    DOB: [birth year], 52 y.o.   MRN: [id number]  HPI  52 year old patient who is seen today for a annual preventive health evaluation. He has a history of essential hypertension and dyslipidemia.  He has obesity consultative by OSA and is followed by pulmonary medicine.  He did have a colonoscopy that did reveal colonic polyps.  He will be due for a follow-up colonoscopy in 3 years.  No other concerns or complaints.  He does have a history of impaired glucose tolerance  Past Medical History:  Diagnosis Date  . Hyperlipidemia   . Hypertension   . OSA (obstructive sleep apnea) 06/21/2014     Social History   Socioeconomic History  . Marital status: Divorced    Spouse name: Not on file  . Number of children: Not on file  . Years of education: Not on file  . Highest education level: Not on file  Social Needs  . Financial resource strain: Not on file  . Food insecurity - worry: Not on file  . Food insecurity - inability: Not on file  . Transportation needs - medical: Not on file  . Transportation needs - non-medical: Not on file  Occupational History  . Not on file  Tobacco Use  . Smoking status: Former Smoker    Types: Cigarettes    Last attempt to quit: 11/23/1995    Years since quitting: 21.4  . Smokeless tobacco: Never Used  Substance and Sexual Activity  . Alcohol use: No  . Drug use: No  . Sexual activity: Yes    Partners: Female  Other Topics Concern  . Not on file  Social History Narrative  . Not on file    Past Surgical History:  Procedure Laterality Date  . NO PAST SURGERIES      Family History  Problem Relation Age of Onset  . Breast cancer Sister   . Colon cancer Neg Hx   . Breast cancer Sister     No Known Allergies  Current Outpatient Medications on File Prior to Visit  Medication Sig Dispense Refill  . amLODipine (NORVASC) 10 MG tablet TAKE 1 TABLET BY MOUTH EVERY DAY 90 tablet 0  . atorvastatin  (LIPITOR) 40 MG tablet TAKE 1 TABLET BY MOUTH DAILY 90 tablet 0  . HYDROcodone-acetaminophen (NORCO/VICODIN) 5-325 MG tablet Take 1 tablet by mouth every 6 (six) hours as needed for moderate pain. 30 tablet 0  . oseltamivir (TAMIFLU) 75 MG capsule Take 1 capsule (75 mg total) by mouth 2 (two) times daily. 10 capsule 0   No current facility-administered medications on file prior to visit.     BP 130/76 (BP Location: Right Arm, Patient Position: Sitting, Cuff Size: Large)   Pulse 76   Temp 98.8 F (37.1 C) (Oral)   Ht 5\' 7"  (1.702 m)   Wt 254 lb (115.2 kg)   SpO2 97%   BMI 39.78 kg/m     Review of Systems  Constitutional: Negative for appetite change, chills, fatigue and fever.  HENT: Negative for congestion, dental problem, ear pain, hearing loss, sore throat, tinnitus, trouble swallowing and voice change.   Eyes: Negative for pain, discharge and visual disturbance.  Respiratory: Negative for cough, chest tightness, wheezing and stridor.   Cardiovascular: Negative for chest pain, palpitations and leg swelling.  Gastrointestinal: Positive for abdominal pain. Negative for abdominal distention, blood in stool, constipation, diarrhea, nausea and vomiting.  Genitourinary: Negative for difficulty urinating,  discharge, flank pain, genital sores, hematuria and urgency.  Musculoskeletal: Negative for arthralgias, back pain, gait problem, joint swelling, myalgias and neck stiffness.  Skin: Negative for rash.  Neurological: Negative for dizziness, syncope, speech difficulty, weakness, numbness and headaches.  Hematological: Negative for adenopathy. Does not bruise/bleed easily.  Psychiatric/Behavioral: Negative for behavioral problems and dysphoric mood. The patient is not nervous/anxious.        Objective:   Physical Exam  Constitutional: He appears well-developed and well-nourished.  HENT:  Head: Normocephalic and atraumatic.  Right Ear: External ear normal.  Left Ear: External ear  normal.  Nose: Nose normal.  Mouth/Throat: Oropharynx is clear and moist.  Eyes: Conjunctivae and EOM are normal. Pupils are equal, round, and reactive to light. No scleral icterus.  Neck: Normal range of motion. Neck supple. No JVD present. No thyromegaly present.  Cardiovascular: Regular rhythm, normal heart sounds and intact distal pulses. Exam reveals no gallop and no friction rub.  No murmur heard. Pulmonary/Chest: Effort normal and breath sounds normal. He exhibits no tenderness.  Abdominal: Soft. Bowel sounds are normal. He exhibits no distension and no mass. There is no tenderness.  Genitourinary: Prostate normal and penis normal.  Musculoskeletal: Normal range of motion. He exhibits no edema or tenderness.  Lymphadenopathy:    He has no cervical adenopathy.  Neurological: He is alert. He has normal reflexes. No cranial nerve deficit. Coordination normal.  Skin: Skin is warm and dry. No rash noted.  Psychiatric: He has a normal mood and affect. His behavior is normal.          Assessment & Plan:   Preventive health examination Exogenous obesity OSA.  The patient has had success in the past with weight loss with a personal trainer and diet.  He is motivated to resume this program  Essential hypertension stable  dyslipidemia will review a lipid profile Impaired glucose tolerance.  We will check a hemoglobin A1c  Home blood pressure monitoring encouraged Return in 1 year or as needed  Nyoka Cowden

## 2017-05-09 NOTE — Progress Notes (Deleted)
   Subjective:    Patient ID: Gregory Stephenson, male    DOB: 03-Oct-1965, 52 y.o.   MRN: [id number]  HPI    Review of Systems     Objective:   Physical Exam        Assessment & Plan:

## 2017-05-09 NOTE — Patient Instructions (Addendum)
Limit your sodium (Salt) intake  You need to lose weight.  Consider a lower calorie diet and regular exercise.    It is important that you exercise regularly, at least 20 minutes 3 to 4 times per week.  If you develop chest pain or shortness of breath seek  medical attention.  Please check your blood pressure on a regular basis.  If it is consistently greater than 140/90, please make an office appointment.  Avoids foods high in acid such as tomatoes citrus juices, and spicy foods.  Avoid eating within two hours of lying down or before exercising.  Do not overheat.  Try smaller more frequent meals.   Return in one year for follow-up

## 2017-05-10 LAB — HEPATITIS C ANTIBODY
HEP C AB: NONREACTIVE
SIGNAL TO CUT-OFF: 0.02 (ref <1.00)

## 2017-08-08 ENCOUNTER — Other Ambulatory Visit: Payer: Self-pay | Admitting: Internal Medicine

## 2018-02-21 DIAGNOSIS — E785 Hyperlipidemia, unspecified: Secondary | ICD-10-CM | POA: Insufficient documentation

## 2018-02-21 DIAGNOSIS — R7303 Prediabetes: Secondary | ICD-10-CM | POA: Insufficient documentation

## 2018-05-30 ENCOUNTER — Telehealth: Payer: Self-pay | Admitting: *Deleted

## 2018-05-30 NOTE — Telephone Encounter (Signed)
Left message on machine. Patient will need a TOC for further refills CRM

## 2019-07-26 ENCOUNTER — Other Ambulatory Visit: Payer: Self-pay

## 2019-07-26 ENCOUNTER — Ambulatory Visit (AMBULATORY_SURGERY_CENTER): Payer: Self-pay | Admitting: *Deleted

## 2019-07-26 VITALS — Ht 67.0 in | Wt 251.0 lb

## 2019-07-26 DIAGNOSIS — Z86010 Personal history of colonic polyps: Secondary | ICD-10-CM

## 2019-07-26 MED ORDER — PLENVU 140 G PO SOLR: 1.0000 | ORAL | 0 refills | Status: DC

## 2019-07-26 NOTE — Progress Notes (Signed)
05-26-2019 comp covid vaccines   No egg or soy allergy known to patient  No issues with past sedation with any surgeries  or procedures, no intubation problems  No diet pills per patient No home 02 use per patient  No blood thinners per patient  Pt denies issues with constipation  No A fib or A flutter  EMMI video sent to pt's e mail   Plenvu sample- Lot [id number]  Exp 02/2020- pt cant swallow Sutab   Due to the COVID-19 pandemic we are asking patients to follow these guidelines. Please only bring one care partner. Please be aware that your care partner may wait in the car in the parking lot or if they feel like they will be too hot to wait in the car, they may wait in the lobby on the 4th floor. All care partners are required to wear a mask the entire time (we do not have any that we can provide them), they need to practice social distancing, and we will do a Covid check for all patient's and care partners when you arrive. Also we will check their temperature and your temperature. If the care partner waits in their car they need to stay in the parking lot the entire time and we will call them on their cell phone when the patient is ready for discharge so they can bring the car to the front of the building. Also all patient's will need to wear a mask into building.

## 2019-07-30 ENCOUNTER — Encounter: Payer: Self-pay | Admitting: Gastroenterology

## 2019-08-08 ENCOUNTER — Ambulatory Visit (AMBULATORY_SURGERY_CENTER): Payer: BC Managed Care – PPO | Admitting: Gastroenterology

## 2019-08-08 ENCOUNTER — Other Ambulatory Visit: Payer: Self-pay

## 2019-08-08 ENCOUNTER — Encounter: Payer: Self-pay | Admitting: Gastroenterology

## 2019-08-08 VITALS — BP 144/77 | HR 55 | Temp 97.7°F | Resp 16 | Ht 67.0 in | Wt 251.0 lb

## 2019-08-08 DIAGNOSIS — Z86010 Personal history of colonic polyps: Secondary | ICD-10-CM

## 2019-08-08 DIAGNOSIS — K635 Polyp of colon: Secondary | ICD-10-CM | POA: Diagnosis not present

## 2019-08-08 DIAGNOSIS — D124 Benign neoplasm of descending colon: Secondary | ICD-10-CM

## 2019-08-08 MED ORDER — SODIUM CHLORIDE 0.9 % IV SOLN: 500.0000 mL | Freq: Once | INTRAVENOUS | Status: DC

## 2019-08-08 NOTE — Op Note (Signed)
[facility] Patient Name: Gregory Stephenson Procedure Date: 08/08/2019 9:51 AM MRN: [id number] Endoscopist: Ladene Artist , MD Age: 54 Referring MD:  Date of Birth: [birth year] Gender: Male Account #: [id number] Procedure:                Colonoscopy Indications:              Surveillance: Personal history of adenomatous                            polyps on last colonoscopy 5 years ago Medicines:                Monitored Anesthesia Care Procedure:                Pre-Anesthesia Assessment:                           - Prior to the procedure, a History and Physical                            was performed, and patient medications and                            allergies were reviewed. The patient's tolerance of                            previous anesthesia was also reviewed. The risks                            and benefits of the procedure and the sedation                            options and risks were discussed with the patient.                            All questions were answered, and informed consent                            was obtained. Prior Anticoagulants: The patient has                            taken no previous anticoagulant or antiplatelet                            agents. ASA Grade Assessment: III - A patient with                            severe systemic disease. After reviewing the risks                            and benefits, the patient was deemed in                            satisfactory condition to undergo the procedure.  After obtaining informed consent, the colonoscope                            was passed under direct vision. Throughout the                            procedure, the patient's blood pressure, pulse, and                            oxygen saturations were monitored continuously. The                            Colonoscope was introduced through the anus and                            advanced to the the cecum,  identified by                            appendiceal orifice and ileocecal valve. The                            ileocecal valve, appendiceal orifice, and rectum                            were photographed. The quality of the bowel                            preparation was good. The colonoscopy was performed                            without difficulty. The patient tolerated the                            procedure well. Scope In: 9:57:00 AM Scope Out: 10:10:20 AM Scope Withdrawal Time: 0 hours 10 minutes 52 seconds  Total Procedure Duration: 0 hours 13 minutes 20 seconds  Findings:                 The perianal and digital rectal examinations were                            normal.                           A 7 mm polyp was found in the descending colon. The                            polyp was sessile. The polyp was removed with a                            cold snare. Resection and retrieval were complete.                           Internal hemorrhoids were found during  retroflexion. The hemorrhoids were medium-sized and                            Grade I (internal hemorrhoids that do not prolapse).                           The exam was otherwise without abnormality on                            direct and retroflexion views. Complications:            No immediate complications. Estimated blood loss:                            None. Estimated Blood Loss:     Estimated blood loss: none. Impression:               - One 7 mm polyp in the descending colon, removed                            with a cold snare. Resected and retrieved.                           - Internal hemorrhoids.                           - The examination was otherwise normal on direct                            and retroflexion views. Recommendation:           - Repeat colonoscopy after studies are complete for                            surveillance based on pathology results.                            - Patient has a contact number available for                            emergencies. The signs and symptoms of potential                            delayed complications were discussed with the                            patient. Return to normal activities tomorrow.                            Written discharge instructions were provided to the                            patient.                           - Resume previous diet.                           -  Continue present medications.                           - Await pathology results. Ladene Artist, MD 08/08/2019 10:13:36 AM This report has been signed electronically.

## 2019-08-08 NOTE — Progress Notes (Signed)
Called to room to assist during endoscopic procedure.  Patient ID and intended procedure confirmed with present staff. Received instructions for my participation in the procedure from the performing physician.  

## 2019-08-08 NOTE — Progress Notes (Signed)
To Pacu, vss. Report to [email or web address] ?

## 2019-08-08 NOTE — Patient Instructions (Signed)
YOU HAD AN ENDOSCOPIC PROCEDURE TODAY AT THE [facility]:   Refer to the procedure report that was given to you for any specific questions about what was found during the examination.  If the procedure report does not answer your questions, please call your gastroenterologist to clarify.  If you requested that your care partner not be given the details of your procedure findings, then the procedure report has been included in a sealed envelope for you to review at your convenience later. ° °**Handout given on polyps and hemorrhoids** ° °YOU SHOULD EXPECT: Some feelings of bloating in the abdomen. Passage of more gas than usual.  Walking can help get rid of the air that was put into your GI tract during the procedure and reduce the bloating. If you had a lower endoscopy (such as a colonoscopy or flexible sigmoidoscopy) you may notice spotting of blood in your stool or on the toilet paper. If you underwent a bowel prep for your procedure, you may not have a normal bowel movement for a few days. ° °Please Note:  You might notice some irritation and congestion in your nose or some drainage.  This is from the oxygen used during your procedure.  There is no need for concern and it should clear up in a day or so. ° °SYMPTOMS TO REPORT IMMEDIATELY: ° °Following lower endoscopy (colonoscopy or flexible sigmoidoscopy): ° Excessive amounts of blood in the stool ° Significant tenderness or worsening of abdominal pains ° Swelling of the abdomen that is new, acute ° Fever of 100°F or higher ° ° °For urgent or emergent issues, a gastroenterologist can be reached at any hour by calling [phone]. °Do not use MyChart messaging for urgent concerns.  ° ° °DIET:  We do recommend a small meal at first, but then you may proceed to your regular diet.  Drink plenty of fluids but you should avoid alcoholic beverages for 24 hours. ° °ACTIVITY:  You should plan to take it easy for the rest of today and you should NOT DRIVE  or use heavy machinery until tomorrow (because of the sedation medicines used during the test).   ° °FOLLOW UP: °Our staff will call the number listed on your records 48-72 hours following your procedure to check on you and address any questions or concerns that you may have regarding the information given to you following your procedure. If we do not reach you, we will leave a message.  We will attempt to reach you two times.  During this call, we will ask if you have developed any symptoms of COVID 19. If you develop any symptoms (ie: fever, flu-like symptoms, shortness of breath, cough etc.) before then, please call [phone].  If you test positive for Covid 19 in the 2 weeks post procedure, please call and report this information to us.   ° °If any biopsies were taken you will be contacted by phone or by letter within the next 1-3 weeks.  Please call us at [phone] if you have not heard about the biopsies in 3 weeks.  ° ° °SIGNATURES/CONFIDENTIALITY: °You and/or your care partner have signed paperwork which will be entered into your electronic medical record.  These signatures attest to the fact that that the information above on your After Visit Summary has been reviewed and is understood.  Full responsibility of the confidentiality of this discharge information lies with you and/or your care-partner.  °

## 2019-08-08 NOTE — Progress Notes (Signed)
Pt. Reports no change in his medical or surgical history since his pre-visit 07/26/2019.

## 2019-08-10 ENCOUNTER — Telehealth: Payer: Self-pay

## 2019-08-10 NOTE — Telephone Encounter (Signed)
°  Follow up Call-  Call back number 08/08/2019  Post procedure Call Back phone  # [phone]  Permission to leave phone message Yes  Some recent data might be hidden     Patient questions:  Do you have a fever, pain , or abdominal swelling? No. Pain Score  0 *  Have you tolerated food without any problems? Yes.    Have you been able to return to your normal activities? Yes.    Do you have any questions about your discharge instructions: Diet   No. Medications  No. Follow up visit  No.  Do you have questions or concerns about your Care? No.  Actions: * If pain score is 4 or above: No action needed, pain <4.  1. Have you developed a fever since your procedure? no  2.   Have you had an respiratory symptoms (SOB or cough) since your procedure? no  3.   Have you tested positive for COVID 19 since your procedure no  4.   Have you had any family members/close contacts diagnosed with the COVID 19 since your procedure?  no   If yes to any of these questions please route to Joylene John, RN and Erenest Rasher, RN

## 2019-08-10 NOTE — Telephone Encounter (Signed)
Unable to leave message

## 2019-08-13 ENCOUNTER — Encounter: Payer: Self-pay | Admitting: Gastroenterology

## 2020-03-03 ENCOUNTER — Other Ambulatory Visit: Payer: Self-pay | Admitting: Internal Medicine

## 2020-03-03 DIAGNOSIS — R7989 Other specified abnormal findings of blood chemistry: Secondary | ICD-10-CM

## 2020-03-07 ENCOUNTER — Ambulatory Visit
Admission: RE | Admit: 2020-03-07 | Discharge: 2020-03-07 | Disposition: A | Discharge status: Home / Self Care | Payer: BC Managed Care – PPO | Source: Ambulatory Visit | Attending: Internal Medicine | Admitting: Internal Medicine

## 2020-03-07 ENCOUNTER — Other Ambulatory Visit: Payer: Self-pay

## 2020-03-07 DIAGNOSIS — R7989 Other specified abnormal findings of blood chemistry: Secondary | ICD-10-CM | POA: Insufficient documentation

## 2021-11-08 IMAGING — US US ABDOMEN COMPLETE
1 series · 13 of 25 positions shown · non-contrast
Comparison: None

CLINICAL DATA: Elevated LFTs, history hypertension, hyperlipidemia,
former smoker

EXAM:
ABDOMEN ULTRASOUND COMPLETE

[Series 1: us abdomen complete · 0.23mm/px · 13 of 79 slices shown]
[im 1/79]
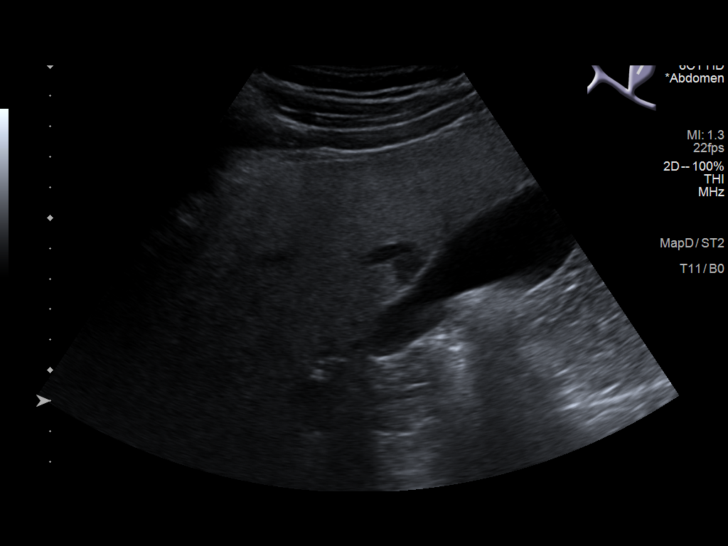
[im 7/79]
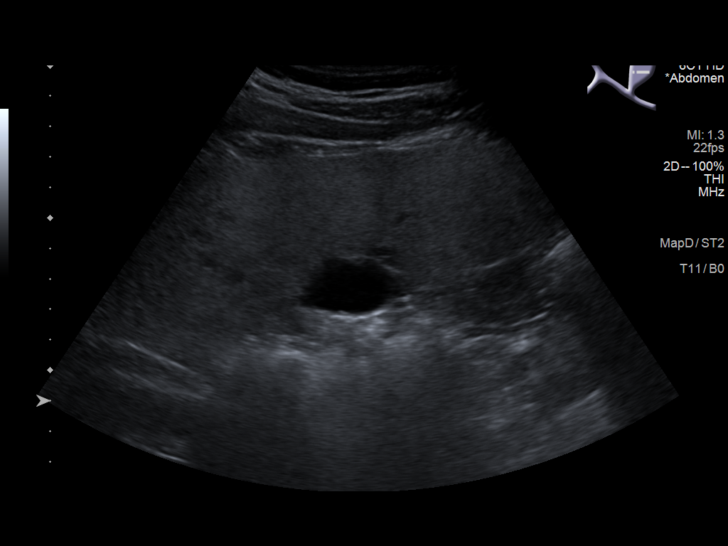
[im 14/79]
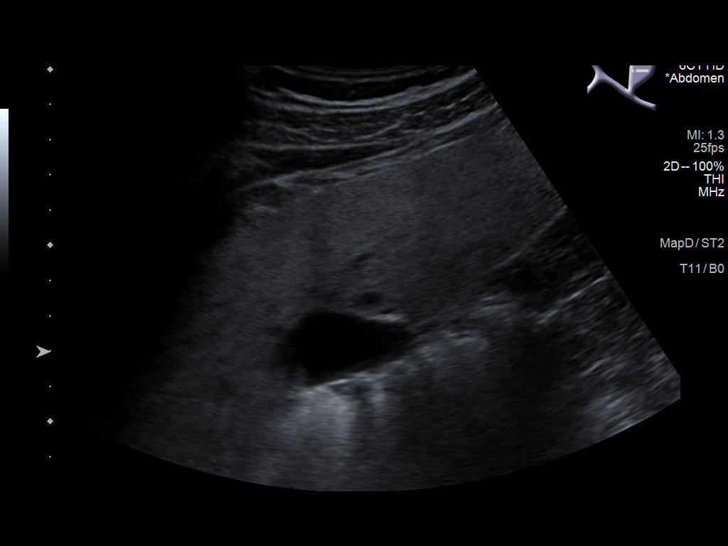
[im 20/79]
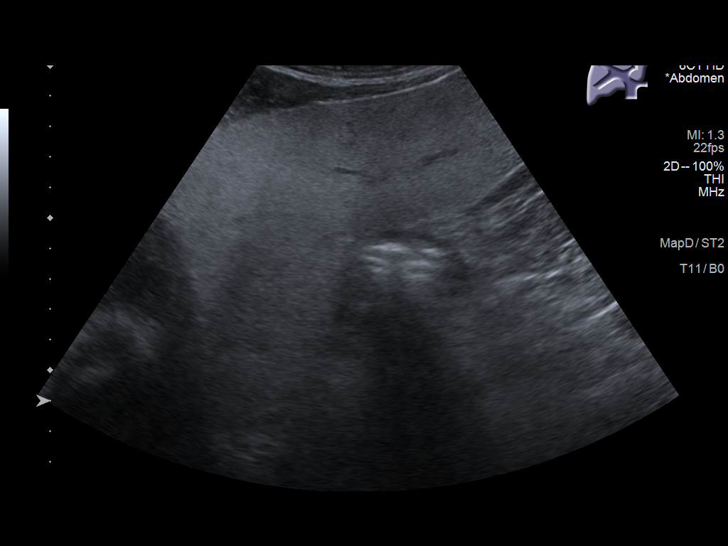
[im 27/79]
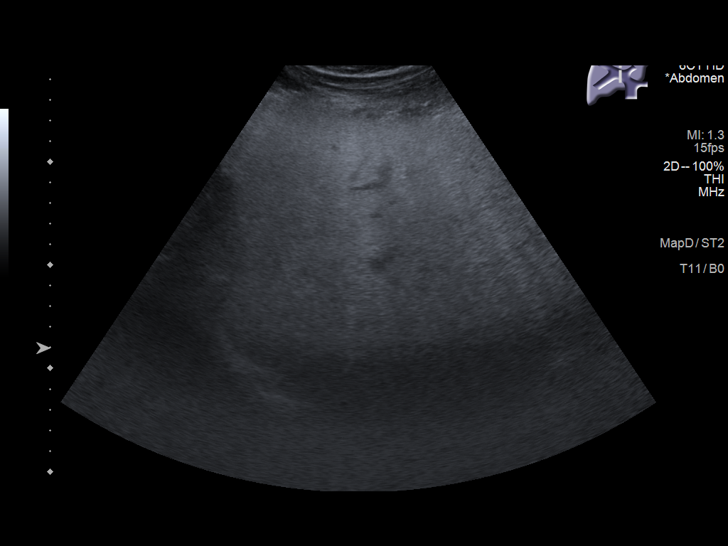
[im 33/79]
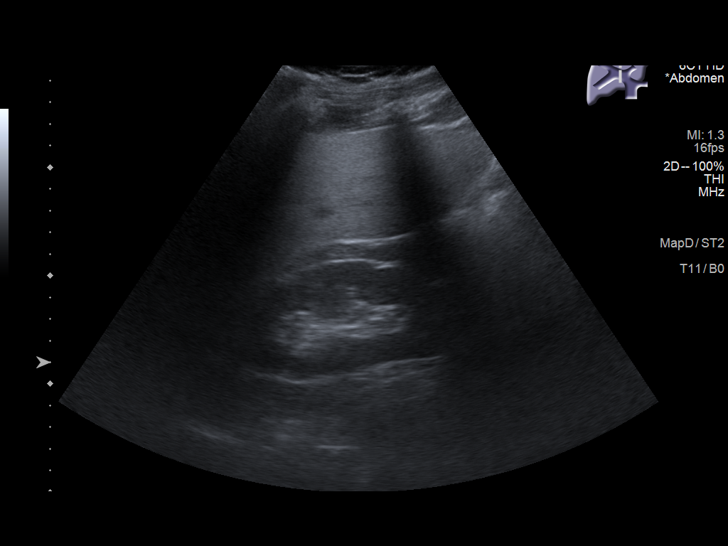
[im 40/79]
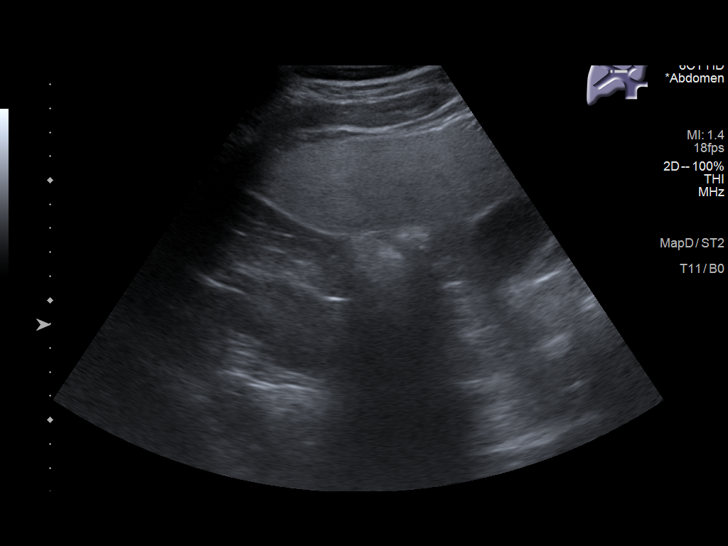
[im 46/79]
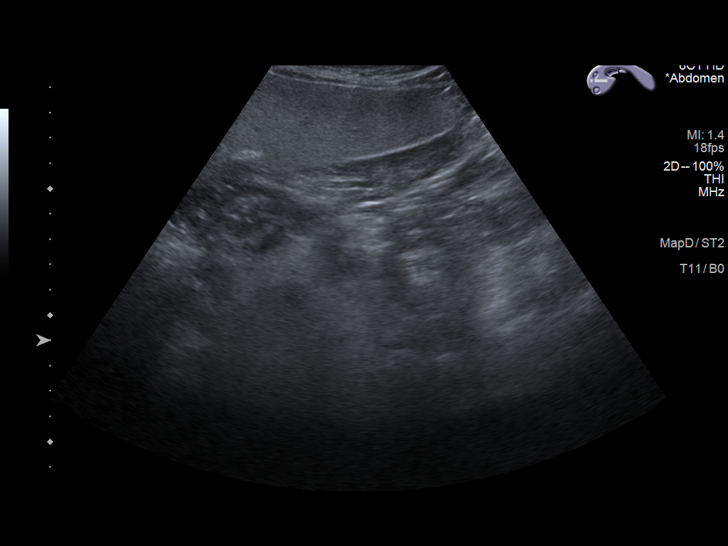
[im 53/79]
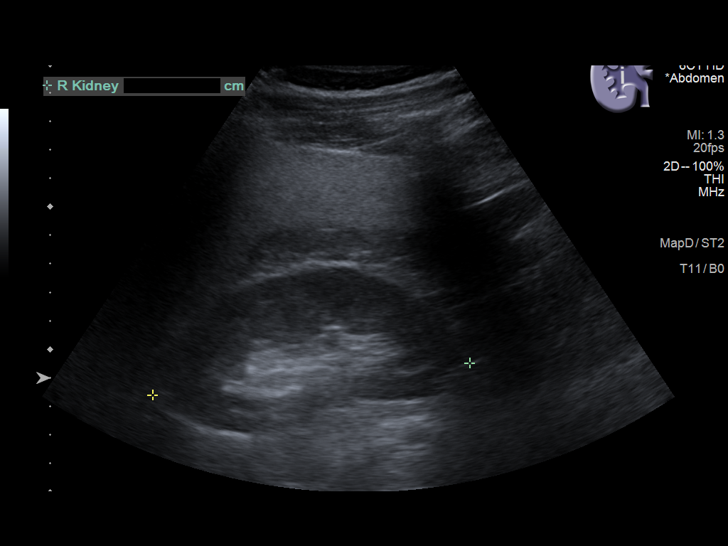
[im 59/79]
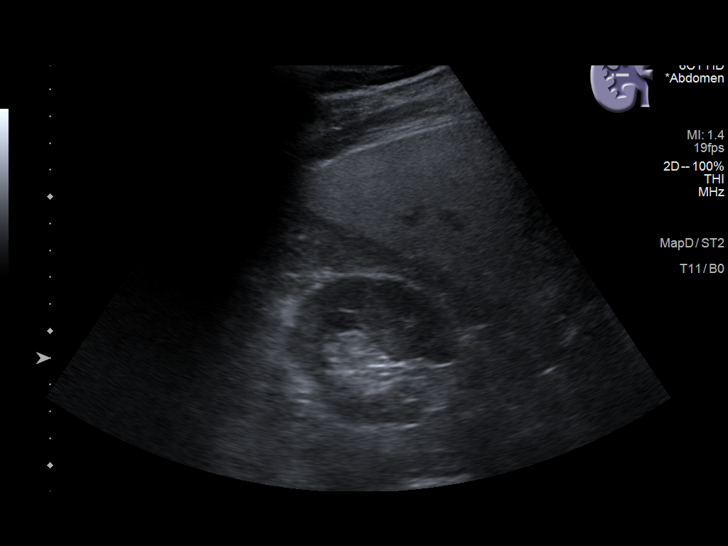
[im 66/79]
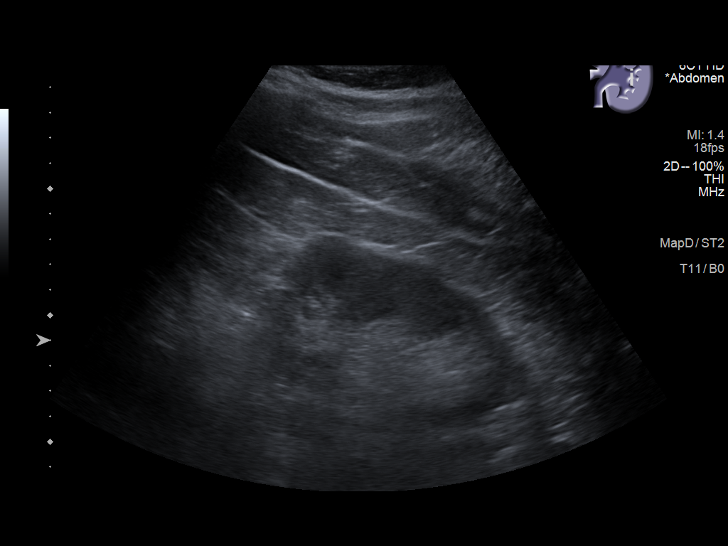
[im 72/79]
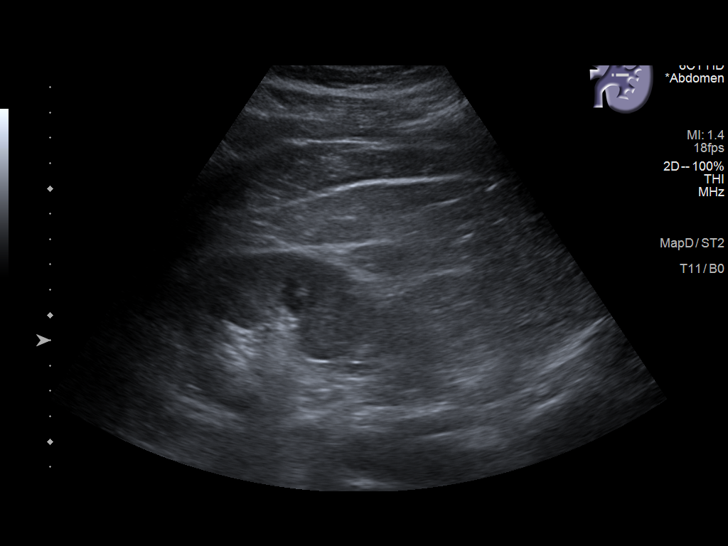
[im 79/79]
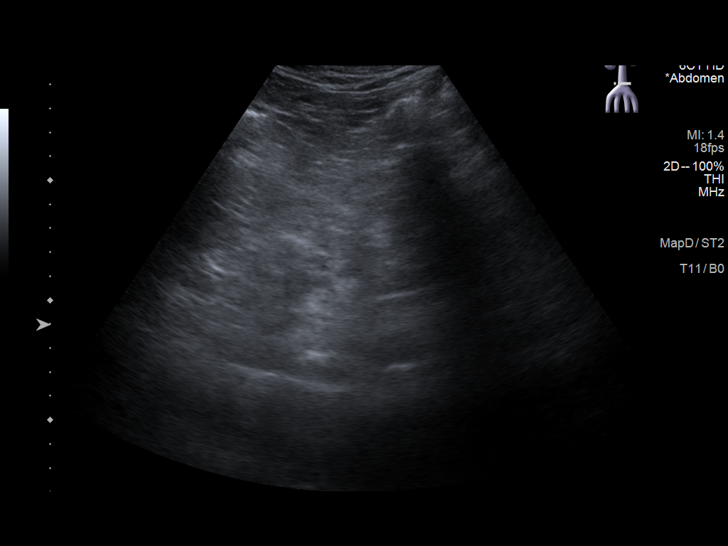

[13 of 25 positions shown; findings below may reference images not displayed]

FINDINGS: Gallbladder: Small amount of dependent sludge in gallbladder. No
shadowing calculi, gallbladder wall thickening or pericholecystic
fluid. No sonographic Murphy sign.

Common bile duct: Diameter: 5 mm, normal

Liver: Echogenic parenchyma, likely fatty infiltration though this
can be seen with cirrhosis and certain infiltrative disorders. No
focal hepatic mass or nodularity identified portal vein is patent on
color Doppler imaging with normal direction of blood flow towards
the liver.

IVC: Inadequately visualized due to bowel gas and sound attenuation
by liver

Pancreas: Portion of pancreatic body and proximal tail normal
appearance, remainder obscured by bowel gas

Spleen: Poorly visualized due to bowel, estimated 5.0 cm length

Right Kidney: Length: 11.2 cm. Normal morphology without mass or
hydronephrosis.

Left Kidney: Length: 10.5 cm. Normal morphology without mass or
hydronephrosis.

Abdominal aorta: Predominately obscured by bowel gas

Other findings: No free fluid
IMPRESSION: Probable fatty infiltration of liver as above.

Small amount of gallbladder sludge without cholelithiasis or
additional gallbladder abnormality.

Incomplete visualization of IVC, aorta, pancreas, and spleen as
above.

## 2021-11-12 ENCOUNTER — Other Ambulatory Visit: Payer: Self-pay | Admitting: Unknown Physician Specialty

## 2021-11-12 DIAGNOSIS — D37030 Neoplasm of uncertain behavior of the parotid salivary glands: Secondary | ICD-10-CM

## 2021-11-17 ENCOUNTER — Ambulatory Visit
Admission: RE | Admit: 2021-11-17 | Discharge: 2021-11-17 | Disposition: A | Discharge status: Home / Self Care | Payer: BC Managed Care – PPO | Source: Ambulatory Visit | Attending: Unknown Physician Specialty | Admitting: Unknown Physician Specialty

## 2021-11-17 DIAGNOSIS — D37030 Neoplasm of uncertain behavior of the parotid salivary glands: Secondary | ICD-10-CM

## 2021-11-17 MED ORDER — IOPAMIDOL (ISOVUE-300) INJECTION 61%
75.0000 mL | Freq: Once | INTRAVENOUS | Status: AC | PRN
  Administered 2021-11-17: 75 mL via INTRAVENOUS

## 2023-06-06 ENCOUNTER — Other Ambulatory Visit: Payer: Self-pay | Admitting: Family Medicine

## 2023-06-06 DIAGNOSIS — M25512 Pain in left shoulder: Secondary | ICD-10-CM

## 2023-06-11 ENCOUNTER — Ambulatory Visit
Admission: RE | Admit: 2023-06-11 | Discharge: 2023-06-11 | Disposition: A | Discharge status: Home / Self Care | Source: Ambulatory Visit | Attending: Family Medicine | Admitting: Family Medicine

## 2023-06-11 DIAGNOSIS — M25512 Pain in left shoulder: Secondary | ICD-10-CM | POA: Insufficient documentation
